# Patient Record
Sex: Female | Born: 1945 | Race: White | Hispanic: No
Health system: Southern US, Community
[De-identification: ages and names within clinical notes are randomized; demographics above are authoritative.]

## PROBLEM LIST (undated history)

## (undated) DIAGNOSIS — F1011 Alcohol abuse, in remission: Secondary | ICD-10-CM

## (undated) DIAGNOSIS — I10 Essential (primary) hypertension: Secondary | ICD-10-CM

## (undated) DIAGNOSIS — M8580 Other specified disorders of bone density and structure, unspecified site: Secondary | ICD-10-CM

## (undated) DIAGNOSIS — E7800 Pure hypercholesterolemia, unspecified: Secondary | ICD-10-CM

## (undated) DIAGNOSIS — F419 Anxiety disorder, unspecified: Secondary | ICD-10-CM

## (undated) DIAGNOSIS — M25519 Pain in unspecified shoulder: Secondary | ICD-10-CM

## (undated) DIAGNOSIS — E559 Vitamin D deficiency, unspecified: Secondary | ICD-10-CM

## (undated) DIAGNOSIS — G4700 Insomnia, unspecified: Secondary | ICD-10-CM

## (undated) DIAGNOSIS — Z8679 Personal history of other diseases of the circulatory system: Secondary | ICD-10-CM

## (undated) HISTORY — PX: TONSILLECTOMY: SUR1361

## (undated) HISTORY — DX: Pure hypercholesterolemia, unspecified: E78.00

## (undated) HISTORY — DX: Pain in unspecified shoulder: M25.519

## (undated) HISTORY — DX: Anxiety disorder, unspecified: F41.9

## (undated) HISTORY — DX: Alcohol abuse, in remission: F10.11

## (undated) HISTORY — PX: CHOLECYSTECTOMY: SHX55

## (undated) HISTORY — DX: Insomnia, unspecified: G47.00

## (undated) HISTORY — DX: Essential (primary) hypertension: I10

## (undated) HISTORY — DX: Personal history of other diseases of the circulatory system: Z86.79

## (undated) HISTORY — DX: Vitamin D deficiency, unspecified: E55.9

## (undated) HISTORY — DX: Other specified disorders of bone density and structure, unspecified site: M85.80

---

## 2012-01-24 DIAGNOSIS — J329 Chronic sinusitis, unspecified: Secondary | ICD-10-CM | POA: Diagnosis not present

## 2012-02-06 DIAGNOSIS — M25819 Other specified joint disorders, unspecified shoulder: Secondary | ICD-10-CM | POA: Diagnosis not present

## 2012-03-05 DIAGNOSIS — M25819 Other specified joint disorders, unspecified shoulder: Secondary | ICD-10-CM | POA: Diagnosis not present

## 2012-05-22 DIAGNOSIS — Z23 Encounter for immunization: Secondary | ICD-10-CM | POA: Diagnosis not present

## 2012-05-30 DIAGNOSIS — N898 Other specified noninflammatory disorders of vagina: Secondary | ICD-10-CM | POA: Diagnosis not present

## 2012-07-07 DIAGNOSIS — M25549 Pain in joints of unspecified hand: Secondary | ICD-10-CM | POA: Diagnosis not present

## 2012-07-07 DIAGNOSIS — M7520 Bicipital tendinitis, unspecified shoulder: Secondary | ICD-10-CM | POA: Diagnosis not present

## 2012-07-07 DIAGNOSIS — Z79899 Other long term (current) drug therapy: Secondary | ICD-10-CM | POA: Diagnosis not present

## 2012-10-24 DIAGNOSIS — Z23 Encounter for immunization: Secondary | ICD-10-CM | POA: Diagnosis not present

## 2013-01-27 DIAGNOSIS — Z23 Encounter for immunization: Secondary | ICD-10-CM | POA: Diagnosis not present

## 2013-01-27 DIAGNOSIS — T753XXA Motion sickness, initial encounter: Secondary | ICD-10-CM | POA: Diagnosis not present

## 2013-01-27 DIAGNOSIS — Z7189 Other specified counseling: Secondary | ICD-10-CM | POA: Diagnosis not present

## 2013-05-06 DIAGNOSIS — I10 Essential (primary) hypertension: Secondary | ICD-10-CM | POA: Diagnosis not present

## 2013-05-06 DIAGNOSIS — E7800 Pure hypercholesterolemia, unspecified: Secondary | ICD-10-CM | POA: Diagnosis not present

## 2013-05-06 DIAGNOSIS — E559 Vitamin D deficiency, unspecified: Secondary | ICD-10-CM | POA: Diagnosis not present

## 2013-05-06 DIAGNOSIS — Z79899 Other long term (current) drug therapy: Secondary | ICD-10-CM | POA: Diagnosis not present

## 2013-06-11 DIAGNOSIS — H524 Presbyopia: Secondary | ICD-10-CM | POA: Diagnosis not present

## 2013-06-11 DIAGNOSIS — H52229 Regular astigmatism, unspecified eye: Secondary | ICD-10-CM | POA: Diagnosis not present

## 2013-06-11 DIAGNOSIS — H5200 Hypermetropia, unspecified eye: Secondary | ICD-10-CM | POA: Diagnosis not present

## 2013-06-11 DIAGNOSIS — H2589 Other age-related cataract: Secondary | ICD-10-CM | POA: Diagnosis not present

## 2013-07-09 DIAGNOSIS — M25569 Pain in unspecified knee: Secondary | ICD-10-CM | POA: Diagnosis not present

## 2013-07-16 DIAGNOSIS — M25569 Pain in unspecified knee: Secondary | ICD-10-CM | POA: Diagnosis not present

## 2013-08-11 DIAGNOSIS — M25569 Pain in unspecified knee: Secondary | ICD-10-CM | POA: Diagnosis not present

## 2013-08-23 DIAGNOSIS — J209 Acute bronchitis, unspecified: Secondary | ICD-10-CM | POA: Diagnosis not present

## 2013-09-21 DIAGNOSIS — E559 Vitamin D deficiency, unspecified: Secondary | ICD-10-CM | POA: Diagnosis not present

## 2013-09-21 DIAGNOSIS — E7800 Pure hypercholesterolemia, unspecified: Secondary | ICD-10-CM | POA: Diagnosis not present

## 2013-12-09 DIAGNOSIS — H5200 Hypermetropia, unspecified eye: Secondary | ICD-10-CM | POA: Diagnosis not present

## 2013-12-09 DIAGNOSIS — H179 Unspecified corneal scar and opacity: Secondary | ICD-10-CM | POA: Diagnosis not present

## 2013-12-09 DIAGNOSIS — H52229 Regular astigmatism, unspecified eye: Secondary | ICD-10-CM | POA: Diagnosis not present

## 2013-12-09 DIAGNOSIS — H2510 Age-related nuclear cataract, unspecified eye: Secondary | ICD-10-CM | POA: Diagnosis not present

## 2014-04-21 DIAGNOSIS — Z1331 Encounter for screening for depression: Secondary | ICD-10-CM | POA: Diagnosis not present

## 2014-04-21 DIAGNOSIS — I10 Essential (primary) hypertension: Secondary | ICD-10-CM | POA: Diagnosis not present

## 2014-06-09 DIAGNOSIS — H2510 Age-related nuclear cataract, unspecified eye: Secondary | ICD-10-CM | POA: Diagnosis not present

## 2014-06-09 DIAGNOSIS — H35369 Drusen (degenerative) of macula, unspecified eye: Secondary | ICD-10-CM | POA: Diagnosis not present

## 2014-08-09 DIAGNOSIS — Z79899 Other long term (current) drug therapy: Secondary | ICD-10-CM | POA: Diagnosis not present

## 2014-08-09 DIAGNOSIS — E7800 Pure hypercholesterolemia, unspecified: Secondary | ICD-10-CM | POA: Diagnosis not present

## 2014-08-09 DIAGNOSIS — I10 Essential (primary) hypertension: Secondary | ICD-10-CM | POA: Diagnosis not present

## 2014-08-09 DIAGNOSIS — E559 Vitamin D deficiency, unspecified: Secondary | ICD-10-CM | POA: Diagnosis not present

## 2014-12-23 DIAGNOSIS — M23304 Other meniscus derangements, unspecified medial meniscus, left knee: Secondary | ICD-10-CM | POA: Diagnosis not present

## 2014-12-23 DIAGNOSIS — M25562 Pain in left knee: Secondary | ICD-10-CM | POA: Diagnosis not present

## 2015-04-25 DIAGNOSIS — M151 Heberden's nodes (with arthropathy): Secondary | ICD-10-CM | POA: Diagnosis not present

## 2015-04-25 DIAGNOSIS — M1812 Unilateral primary osteoarthritis of first carpometacarpal joint, left hand: Secondary | ICD-10-CM | POA: Diagnosis not present

## 2015-04-29 DIAGNOSIS — E780 Pure hypercholesterolemia: Secondary | ICD-10-CM | POA: Diagnosis not present

## 2015-04-29 DIAGNOSIS — Z79899 Other long term (current) drug therapy: Secondary | ICD-10-CM | POA: Diagnosis not present

## 2015-04-29 DIAGNOSIS — I10 Essential (primary) hypertension: Secondary | ICD-10-CM | POA: Diagnosis not present

## 2015-04-29 DIAGNOSIS — Z1389 Encounter for screening for other disorder: Secondary | ICD-10-CM | POA: Diagnosis not present

## 2015-04-29 DIAGNOSIS — F419 Anxiety disorder, unspecified: Secondary | ICD-10-CM | POA: Diagnosis not present

## 2015-04-29 DIAGNOSIS — Z23 Encounter for immunization: Secondary | ICD-10-CM | POA: Diagnosis not present

## 2015-04-29 DIAGNOSIS — E559 Vitamin D deficiency, unspecified: Secondary | ICD-10-CM | POA: Diagnosis not present

## 2015-06-13 DIAGNOSIS — M1812 Unilateral primary osteoarthritis of first carpometacarpal joint, left hand: Secondary | ICD-10-CM | POA: Diagnosis not present

## 2015-09-07 DIAGNOSIS — F419 Anxiety disorder, unspecified: Secondary | ICD-10-CM | POA: Diagnosis not present

## 2015-09-07 DIAGNOSIS — Z87898 Personal history of other specified conditions: Secondary | ICD-10-CM | POA: Diagnosis not present

## 2016-02-27 DIAGNOSIS — H25819 Combined forms of age-related cataract, unspecified eye: Secondary | ICD-10-CM | POA: Diagnosis not present

## 2016-02-27 DIAGNOSIS — H5203 Hypermetropia, bilateral: Secondary | ICD-10-CM | POA: Diagnosis not present

## 2016-02-27 DIAGNOSIS — H00022 Hordeolum internum right lower eyelid: Secondary | ICD-10-CM | POA: Diagnosis not present

## 2016-02-27 DIAGNOSIS — H52223 Regular astigmatism, bilateral: Secondary | ICD-10-CM | POA: Diagnosis not present

## 2016-05-04 DIAGNOSIS — E559 Vitamin D deficiency, unspecified: Secondary | ICD-10-CM | POA: Diagnosis not present

## 2016-05-04 DIAGNOSIS — R6889 Other general symptoms and signs: Secondary | ICD-10-CM | POA: Diagnosis not present

## 2016-05-04 DIAGNOSIS — E7800 Pure hypercholesterolemia, unspecified: Secondary | ICD-10-CM | POA: Diagnosis not present

## 2016-05-04 DIAGNOSIS — R202 Paresthesia of skin: Secondary | ICD-10-CM | POA: Diagnosis not present

## 2016-05-04 DIAGNOSIS — Z79899 Other long term (current) drug therapy: Secondary | ICD-10-CM | POA: Diagnosis not present

## 2016-05-22 DIAGNOSIS — E559 Vitamin D deficiency, unspecified: Secondary | ICD-10-CM | POA: Diagnosis not present

## 2016-06-25 ENCOUNTER — Encounter (HOSPITAL_COMMUNITY): Payer: Self-pay

## 2016-06-25 ENCOUNTER — Ambulatory Visit (HOSPITAL_COMMUNITY): Payer: Medicare Other | Admitting: Psychology

## 2016-06-25 ENCOUNTER — Other Ambulatory Visit (HOSPITAL_COMMUNITY): Payer: Medicare Other

## 2016-06-25 ENCOUNTER — Encounter (HOSPITAL_COMMUNITY): Payer: Self-pay | Admitting: Psychology

## 2016-06-25 DIAGNOSIS — F1020 Alcohol dependence, uncomplicated: Secondary | ICD-10-CM

## 2016-06-26 ENCOUNTER — Emergency Department (HOSPITAL_COMMUNITY): Payer: Medicare Other

## 2016-06-26 ENCOUNTER — Encounter (HOSPITAL_COMMUNITY): Payer: Self-pay

## 2016-06-26 ENCOUNTER — Emergency Department (HOSPITAL_COMMUNITY)
Admission: EM | Admit: 2016-06-26 | Discharge: 2016-06-26 | Disposition: A | Discharge status: Home / Self Care | Payer: Medicare Other | Attending: Emergency Medicine | Admitting: Emergency Medicine

## 2016-06-26 DIAGNOSIS — Y999 Unspecified external cause status: Secondary | ICD-10-CM | POA: Diagnosis not present

## 2016-06-26 DIAGNOSIS — Z79899 Other long term (current) drug therapy: Secondary | ICD-10-CM | POA: Insufficient documentation

## 2016-06-26 DIAGNOSIS — W01119A Fall on same level from slipping, tripping and stumbling with subsequent striking against unspecified sharp object, initial encounter: Secondary | ICD-10-CM | POA: Diagnosis not present

## 2016-06-26 DIAGNOSIS — Y929 Unspecified place or not applicable: Secondary | ICD-10-CM | POA: Insufficient documentation

## 2016-06-26 DIAGNOSIS — S0101XA Laceration without foreign body of scalp, initial encounter: Secondary | ICD-10-CM | POA: Insufficient documentation

## 2016-06-26 DIAGNOSIS — W19XXXA Unspecified fall, initial encounter: Secondary | ICD-10-CM

## 2016-06-26 DIAGNOSIS — S0990XA Unspecified injury of head, initial encounter: Secondary | ICD-10-CM | POA: Diagnosis not present

## 2016-06-26 DIAGNOSIS — Y939 Activity, unspecified: Secondary | ICD-10-CM | POA: Diagnosis not present

## 2016-06-26 DIAGNOSIS — S0191XA Laceration without foreign body of unspecified part of head, initial encounter: Secondary | ICD-10-CM

## 2016-06-26 DIAGNOSIS — S065X0A Traumatic subdural hemorrhage without loss of consciousness, initial encounter: Secondary | ICD-10-CM | POA: Diagnosis not present

## 2016-06-26 MED ORDER — ONDANSETRON HCL 4 MG PO TABS: 4.0000 mg | ORAL_TABLET | Freq: Four times a day (QID) | ORAL | Status: DC

## 2016-06-26 MED ORDER — ONDANSETRON 4 MG PO TBDP
4.0000 mg | ORAL_TABLET | Freq: Once | ORAL | Status: AC
  Administered 2016-06-26: 4 mg via ORAL
  Filled 2016-06-26: qty 1

## 2016-06-26 MED ORDER — LIDOCAINE HCL (PF) 1 % IJ SOLN
5.0000 mL | Freq: Once | INTRAMUSCULAR | Status: AC
  Administered 2016-06-26: 5 mL via INTRADERMAL
  Filled 2016-06-26: qty 30

## 2016-06-26 MED ORDER — ACETAMINOPHEN 325 MG PO TABS
650.0000 mg | ORAL_TABLET | Freq: Once | ORAL | Status: AC
  Administered 2016-06-26: 650 mg via ORAL
  Filled 2016-06-26: qty 2

## 2016-06-26 NOTE — ED Notes (Signed)
Pt fell last night. She was drinking and tripped.  Fell backwards striking head on pavement.  Laceration noted.  Bleeding is minimal at this time although is still actively bleeding when injury was approx 12 hours ago.  Pt denies LOC but is vague in response.  Unable to determine

## 2016-06-26 NOTE — ED Provider Notes (Signed)
CSN: [id number]     Arrival date & time 06/26/16  1327 History  By signing my name below, I, Freida Busman, attest that this documentation has been prepared under the direction and in the presence of non-physician practitioner, Cheri Fowler, PA-C. Electronically Signed: Freida Busman, Scribe. 06/26/2016. 2:02 PM.   Chief Complaint  Patient presents with  . Head Laceration    The history is provided by the patient. No language interpreter was used.    HPI Comments:  Kathleen Robles is a 70 y.o. female with a history of alcohol abuse, who presents to the Emergency Department complaining of a laceration to the back of her head s/p fall last night ~2300; bleeding controlled at this time. She reports associated mild HA. Pt rates her pain a 4/10. She has taken ibuprofen with mild relief. Pt states she was intoxicated when she tripped and fell backwards and struck the back of her head on concrete. She denies LOC, vomiting, numbness/weakenss in her extremities, vision changes, or neck pain. She also denies use of blood thinners. Pt reports nausea at this time and dizziness with leaning forward. Tetanus is UTD.   History reviewed. No pertinent past medical history. Past Surgical History  Procedure Laterality Date  . Tonsillectomy    . Cholecystectomy     History reviewed. No pertinent family history. Social History  Substance Use Topics  . Smoking status: Never Smoker   . Smokeless tobacco: None  . Alcohol Use: Yes   OB History    No data available     Review of Systems  Eyes: Negative for visual disturbance.  Gastrointestinal: Positive for nausea. Negative for vomiting.  Skin: Positive for wound.  Neurological: Positive for headaches. Negative for syncope.  All other systems reviewed and are negative.   Allergies  Codeine  Home Medications   Prior to Admission medications   Medication Sig Start Date End Date Taking? Authorizing Provider  sertraline (ZOLOFT) 25 MG tablet 1 TABLET ONCE A  DAY ORALLY 06/18/16  Yes Historical Provider, MD  trandolapril (MAVIK) 2 MG tablet 1 TABLET ONCE A DAY BY MOUTH 04/26/16  Yes Historical Provider, MD  traZODone (DESYREL) 100 MG tablet Take 100 mg by mouth at bedtime as needed for sleep.  06/22/16  Yes Historical Provider, MD  hydrOXYzine (ATARAX/VISTARIL) 10 MG tablet TAKE ONE TABLET BY MOUTH DAILY AT BEDTIME FOR ANXIETY 05/14/16   Historical Provider, MD  Vitamin D, Ergocalciferol, (DRISDOL) 50000 units CAPS capsule TAKE 1 CAPSULE ONCE A WEEK ORALLY 05/08/16   Historical Provider, MD   BP 161/88 mmHg  Pulse 91  Temp(Src) 98.6 F (37 C) (Oral)  Resp 18  SpO2 100% Physical Exam  Constitutional: She is oriented to person, place, and time. She appears well-developed and well-nourished.  Non-toxic appearance. She does not have a sickly appearance. She does not appear ill.  HENT:  Head: Normocephalic. Head is with laceration.  Mouth/Throat: Oropharynx is clear and moist.  3 cm laceration to the superior posterior parietal scalp.   Eyes: Conjunctivae are normal. Pupils are equal, round, and reactive to light.  Neck: Normal range of motion. Neck supple.  No cervical midline tenderness.   Cardiovascular: Normal rate and regular rhythm.   Pulmonary/Chest: Effort normal and breath sounds normal. No accessory muscle usage or stridor. No respiratory distress. She has no wheezes. She has no rhonchi. She has no rales.  Abdominal: Soft. Bowel sounds are normal. She exhibits no distension. There is no tenderness.  Musculoskeletal: Normal range of motion.  Lymphadenopathy:    She has no cervical adenopathy.  Neurological: She is alert and oriented to person, place, and time.  Mental Status:   AOx3.  Speech clear without dysarthria. Cranial Nerves:  I-not tested  II-PERRLA  III, IV, VI-EOMs intact  V-temporal and masseter strength intact  VII-symmetrical facial movements intact, no facial droop  VIII-hearing grossly intact bilaterally  IX, X-gag  intact  XI-strength of sternomastoid and trapezius muscles 5/5  XII-tongue midline Motor:   Good muscle bulk and tone  Strength 5/5 bilaterally in upper and lower extremities   Cerebellar--intact RAMs, finger to nose intact bilaterally.  Gait normal.  No pronator drift Sensory:  Intact in upper and lower extremities   Skin: Skin is warm and dry.  Psychiatric: She has a normal mood and affect. Her behavior is normal.  Nursing note and vitals reviewed.   ED Course  Procedures  LACERATION REPAIR Performed by: Cheri FowlerKayla Reiana Poteet Consent: Verbal consent obtained. Risks and benefits: risks, benefits and alternatives were discussed Patient identity confirmed: provided demographic data Time out performed prior to procedure Prepped and Draped in normal sterile fashion Wound explored Laceration Location: superior posterior parietal scalp Laceration Length: 3 cm No Foreign Bodies seen or palpated Anesthesia: local infiltration Local anesthetic: lidocaine 1% without epinephrine Anesthetic total: 8 ml Irrigation method: syringe Amount of cleaning: standard Skin closure: staple Number of sutures or staples: 7 Technique: staples Patient tolerance: Patient tolerated the procedure well with no immediate complications.  DIAGNOSTIC STUDIES:  Oxygen Saturation is 100% on RA, normal by my interpretation.    COORDINATION OF CARE:  1:57 PM Will order CT head and repair laceration. Discussed treatment plan with pt at bedside and pt agreed to plan.  Labs Review Labs Reviewed - No data to display  Imaging Review Ct Head Wo Contrast  06/26/2016  CLINICAL DATA:  Pain following fall 1 day prior EXAM: CT HEAD WITHOUT CONTRAST TECHNIQUE: Contiguous axial images were obtained from the base of the skull through the vertex without intravenous contrast. COMPARISON:  None. FINDINGS: Brain: The ventricles are normal in size and configuration. There is a small left-sided tentorial subdural hematoma measuring  1.4 x 1.0 cm which is not causing appreciable mass effect. There are no other extra-axial fluid collections. There is no intra-axial hemorrhage. There is no mass or midline shift. Gray-white compartments appear normal. No acute infarct evident. Vascular: There is minimal calcification in the cavernous carotid artery regions bilaterally. Skull: Bony calvarium appears intact. Sinuses/Orbits: Visualized orbits appear symmetric bilaterally. Visualized paranasal sinuses are clear. Other: Visualized mastoid air cells are clear. There is soft tissue injury in the left occipital region with air in the soft tissues. No fracture is evident in this region. IMPRESSION: Small left-sided tentorial subdural hematoma without appreciable mass effect. No focal gray-white compartment lesions. No acute infarct. No midline shift. Soft tissue injury left occipital scalp.  No fracture. Critical Value/emergent results were called by telephone at the time of interpretation on 06/26/2016 at 2:29 pm to [facility] Cori Henningsen, PA , who verbally acknowledged these results. Electronically Signed   By: Bretta BangWilliam  Woodruff III M.D.   On: 06/26/2016 14:30   I have personally reviewed and evaluated these images and lab results as part of my medical decision-making.   EKG Interpretation None         MDM   Final diagnoses:  Fall, initial encounter  Laceration of head, initial encounter  Head injury, initial encounter   Patient presents with head injury s/p fall while intoxicated  approximately 11 PM yesterday.  No LOC.  VSS, NAD.  3 cm laceration to parietal scalp.  Normal neurological exam, no focal deficits.  [facility], will obtain CT head.  CT head shows small left-sided tentorial subdural hematoma without appreciable mass effect.  Spoke with Dr. Franky Machoabbell, neurosurgery, no indication for further work up given normal neurological exam and CT findings.  Tetanus up to date. Lac repaired with 7 staples.  Able to ambulate in ED  without difficulty.  Recommend Tylenol or Ibuprofen for pain.  Return in 7 days for staple removal.  Dicussed return precautions.  Patient agrees and acknowledges the above plan for discharge.   Case has been discussed with and seen by Dr. Jodi MourningZavitz who agrees with the above plan for discharge.  I personally performed the services described in this documentation, which was scribed in my presence. The recorded information has been reviewed and is accurate.    Cheri FowlerKayla Dorothee Napierkowski, PA-C 06/26/16 1551  Blane OharaJoshua Zavitz, MD 06/26/16 [phone]

## 2016-06-26 NOTE — Discharge Instructions (Signed)
Head Injury, Adult You have a head injury. Headaches and throwing up (vomiting) are common after a head injury. It should be easy to wake up from sleeping. Sometimes you must stay in the hospital. Most problems happen within the first 24 hours. Side effects may occur up to 7-10 days after the injury.  WHAT ARE THE TYPES OF HEAD INJURIES? Head injuries can be as minor as a bump. Some head injuries can be more severe. More severe head injuries include:  A jarring injury to the brain (concussion).  A bruise of the brain (contusion). This mean there is bleeding in the brain that can cause swelling.  A cracked skull (skull fracture).  Bleeding in the brain that collects, clots, and forms a bump (hematoma). WHEN SHOULD I GET HELP RIGHT AWAY?   You are confused or sleepy.  You cannot be woken up.  You feel sick to your stomach (nauseous) or keep throwing up (vomiting).  Your dizziness or unsteadiness is getting worse.  You have very bad, lasting headaches that are not helped by medicine. Take medicines only as told by your doctor.  You cannot use your arms or legs like normal.  You cannot walk.  You notice changes in the black spots in the center of the colored part of your eye (pupil).  You have clear or bloody fluid coming from your nose or ears.  You have trouble seeing. During the next 24 hours after the injury, you must stay with someone who can watch you. This person should get help right away (call 911 in the [place] if you start to shake and are not able to control it (have seizures), you pass out, or you are unable to wake up. HOW CAN I PREVENT A HEAD INJURY IN THE FUTURE?  Wear seat belts.  Wear a helmet while bike riding and playing sports like football.  Stay away from dangerous activities around the house. WHEN CAN I RETURN TO NORMAL ACTIVITIES AND ATHLETICS? See your doctor before doing these activities. You should not do normal activities or play contact sports until 1  week after the following symptoms have stopped:  Headache that does not go away.  Dizziness.  Poor attention.  Confusion.  Memory problems.  Sickness to your stomach or throwing up.  Tiredness.  Fussiness.  Bothered by bright lights or loud noises.  Anxiousness or depression.  Restless sleep. MAKE SURE YOU:   Understand these instructions.  Will watch your condition.  Will get help right away if you are not doing well or get worse.   This information is not intended to replace advice given to you by your health care provider. Make sure you discuss any questions you have with your health care provider.   Document Released: 11/22/2008 Document Revised: 12/31/2014 Document Reviewed: 08/17/2013 Elsevier Interactive Patient Education 2016 [employer].   Stitches, Singac, or Adhesive Wound Closure Health care providers use stitches (sutures), staples, and certain glue (skin adhesives) to hold skin together while it heals (wound closure). You may need this treatment after you have surgery or if you cut your skin accidentally. These methods help your skin to heal more quickly and make it less likely that you will have a scar. A wound may take several months to heal completely. The type of wound you have determines when your wound gets closed. In most cases, the wound is closed as soon as possible (primary skin closure). Sometimes, closure is delayed so the wound can be cleaned and allowed to  heal naturally. This reduces the chance of infection. Delayed closure may be needed if your wound:  Is caused by a bite.  Happened more than 6 hours ago.  Involves loss of skin or the tissues under the skin.  Has dirt or debris in it that cannot be removed.  Is infected. WHAT ARE THE DIFFERENT KINDS OF WOUND CLOSURES? There are many options for wound closure. The one that your health care provider uses depends on how deep and how large your wound is. Adhesive Glue To use this type  of glue to close a wound, your health care provider holds the edges of the wound together and paints the glue on the surface of your skin. You may need more than one layer of glue. Then the wound may be covered with a light bandage (dressing). This type of skin closure may be used for small wounds that are not deep (superficial). Using glue for wound closure is less painful than other methods. It does not require a medicine that numbs the area (local anesthetic). This method also leaves nothing to be removed. Adhesive glue is often used for children and on facial wounds. Adhesive glue cannot be used for wounds that are deep, uneven, or bleeding. It is not used inside of a wound.  Adhesive Strips These strips are made of sticky (adhesive), porous paper. They are applied across your skin edges like a regular adhesive bandage. You leave them on until they fall off. Adhesive strips may be used to close very superficial wounds. They may also be used along with sutures to improve the closure of your skin edges.  Sutures Sutures are the oldest method of wound closure. Sutures can be made from natural substances, such as silk, or from synthetic materials, such as nylon and steel. They can be made from a material that your body can break down as your wound heals (absorbable), or they can be made from a material that needs to be removed from your skin (nonabsorbable). They come in many different strengths and sizes. Your health care provider attaches the sutures to a steel needle on one end. Sutures can be passed through your skin, or through the tissues beneath your skin. Then they are tied and cut. Your skin edges may be closed in one continuous stitch or in separate stitches. Sutures are strong and can be used for all kinds of wounds. Absorbable sutures may be used to close tissues under the skin. The disadvantage of sutures is that they may cause skin reactions that lead to infection. Nonabsorbable sutures need  to be removed. Staples When surgical staples are used to close a wound, the edges of your skin on both sides of the wound are brought close together. A staple is placed across the wound, and an instrument secures the edges together. Staples are often used to close surgical cuts (incisions). Staples are faster to use than sutures, and they cause less skin reaction. Staples need to be removed using a tool that bends the staples away from your skin. HOW DO I CARE FOR MY WOUND CLOSURE?  Take medicines only as directed by your health care provider.  If you were prescribed an antibiotic medicine for your wound, finish it all even if you start to feel better.  Use ointments or creams only as directed by your health care provider.  Wash your hands with soap and water before and after touching your wound.  Do not soak your wound in water. Do not take baths, swim,  or use a hot tub until your health care provider approves.  Ask your health care provider when you can start showering. Cover your wound if directed by your health care provider.  Do not take out your own sutures or staples.  Do not pick at your wound. Picking can cause an infection.  Keep all follow-up visits as directed by your health care provider. This is important. HOW LONG WILL I HAVE MY WOUND CLOSURE?  Leave adhesive glue on your skin until the glue peels away.  Leave adhesive strips on your skin until the strips fall off.  Absorbable sutures will dissolve within several days.  Nonabsorbable sutures and staples must be removed. The location of the wound will determine how long they stay in. This can range from several days to a couple of weeks. WHEN SHOULD I SEEK HELP FOR MY WOUND CLOSURE? Contact your health care provider if:  You have a fever.  You have chills.  You have drainage, redness, swelling, or pain at your wound.  There is a bad smell coming from your wound.  The skin edges of your wound start to separate  after your sutures have been removed.  Your wound becomes thick, raised, and darker in color after your sutures come out (scarring).   This information is not intended to replace advice given to you by your health care provider. Make sure you discuss any questions you have with your health care provider.   Document Released: 09/04/2001 Document Revised: 12/31/2014 Document Reviewed: 05/19/2014 Elsevier Interactive Patient Education 2016 Elsevier Inc.Community Resource Guide Outpatient Counseling/Substance Abuse Adult The [employer] Ways 211 is a great source of information about community services available.  Access by dialing 2-1-1 from anywhere in [place] , or by website -  PooledIncome.plwww.nc211.org.   Other Local Resources (Updated 12/2015)  Crisis Hotlines   Services     Area Served  [facility]  Crisis Hotline, available 24 hours a day, 7 days a week: 954-651-6691760 465 2523 [facility], [place]   [facility]  Crisis Hotline, available 24 hours a day, 7 days a week: [phone] [facility], [place]  [facility]  Suicide Prevention Hotline, available 24 hours a day, 7 days a week: 236-829-9310 [facility] [place], [place]  [facility]   Crisis Hotline, available 24 hours a day, 7 days a week: [phone] [facility], [place]   [facility] Access to [employer]  Crisis Hotline, available 24 hours a day, 7 days a week: [phone] All   [facility]  Crisis Hotline, available 24 hours a day, 7 days a week: 5012386828([phone]) [phone] All   Other Local Resources (Updated 12/2015)  Outpatient Counseling/ Substance Abuse Programs  Services     Address and Phone Number  ADS ([facility] Drug [facility])   Options include Individual counseling, group counseling, intensive outpatient program (several hours a day, several days a week)  Offers depression assessments  Provides methadone maintenance program [phone] [address], [address] [place], [place] [address]   [facility]   Offers partial hospitalization/day treatment and DUI/DWI programs  [employer] Medicare, private insurance [phone] [address], [address] [place], [place] [id number]  [facility]    Services include intensive outpatient program (several hours a day, several days a week), outpatient treatment, DUI/DWI services, family education  Also has some services specifically for [employer]  Offers transitional housing  [phone] [address] [place], [place] [id number]     [facility]  [employer] Medicare, private pay, and private insurance [phone]  [address], [address] [place], [place] [address]  [employer] [facility]  Services include individual counseling, substance abuse intensive outpatient program (several hours a day, several days a week), day treatment  Delene Loll, Medicaid, private insurance [phone] [address], [address] [place], [place] [address]  [facility]   Offers substance abuse intensive outpatient program (several hours a day, several days a week), partial hospitalization program [phone] [address] [place], [place] [address]  [phone] [address] [place], [place] [address]  [phone] [address] [place] [place], [place] [address]  [phone], [address] [place], [place] [address]  [facility]  Individual counseling only  Accepts private insurance only [phone] [address] [place], [place] [address]  [facility]  Methadone maintenance program [phone] [address] [place], [place] [address]  [facility]  Walk-In Clinic providing substance abuse and mental health counseling  Accepts Medicaid, Medicare, private insurance  Offers sliding scale for uninsured [phone] [address] [place], [place]   [facility] [place],  [employer].  Offers individual counseling, and intensive in-home services [phone] [address], [address] [place], [place] [address]  [facility] individual counseling, family counseling, group therapy, domestic violence counseling, consumer credit counseling  Accepts Medicare, Medicaid, private insurance  Offers sliding scale for uninsured [phone] [address] [place], [place] [address]  [phone] [facility], [address] [place], [place] [address]  [facility]  Offers individual, family and group counseling  3 locations - [place], [place], and [place]  [phone]  [address] [place], [place] [address]  [address] [place], [place] [address]  [address] [place], [place] [address]  [name]    Offers psychiatric assessment, 8-week Intensive Outpatient Program (several hours a day, several times a week, daytime or evenings), early recovery group, family Program, medication management  Private pay or private insurance only [phone], or  [phone] [address] [place], [place] [address]  [facility] [employer] individual, couples and family counseling  Accepts Medicaid, private insurance, and sliding scale for uninsured [phone] [address] [place], [place] [address]  Len Blalock, MD  Individual counseling  Private insurance [phone] [address] [place], [place] [address]  [facility]   Offers assessment, substance abuse treatment, and behavioral health treatment [phone] [address] [place], [place] [address]  [facility]  Individual counseling  Accepts private insurance [phone] [address] [place], [place] [address]  [facility]  Individual counseling  Accepts Medicare, private insurance [phone] [address] [place], [place] [address]  [facility]     Offers intensive outpatient program (several hours a day, several times a week)  Private pay, private insurance [phone] [facility] [place], [place]  [facility]  Individual counseling  Medicare, private insurance [phone] [address] [place], [place] [address]  [facility]    Offers intensive outpatient program (several hours a day, several times a week) and partial hospitalization program [phone] [address] [place], [place] [address]  Emerson Monte, MD  Individual counseling [phone] [address] [place], [place] [address]  [facility]  Offers Christian counseling to individuals, couples, and families  Accepts Medicare and private insurance; offers sliding scale for uninsured [phone] [address] [place], [place] [address]  [facility]  Christian counseling [phone] [address] [place], [place] [address]  [facility] crisis counseling, individual counseling, group therapy, in-home therapy, domestic violence services, day treatment, DWI services, Administrator, arts (CST), Doctor, hospital (ACTT), substance abuse Intensive Outpatient Program (several hours a day, several times a week)  2 locations - [place] and [place] [phone] [address] [place], [place] [address]  [phone] [address] [place], [place] [address]  [facility]     Individual counseling and group therapy  [employer], [place], [place] [phone] [address] [place], [place]  [facility]  Offers individual and family counseling  Offers LGBTQ services  Accepts private insurance and private pay [phone] [address] [place], [place] [address]  [facility]    Offers individual counseling, group therapy, and  outpatient detox  Accepts private insurance [phone] [address] [place], [place]  [facility]  Individual counseling  Accepts Medicare, private insurance [phone] [address] [place], [place] [address]  [employer]  Individual counseling  Accepts Medicare, private insurance [phone] [address] [place], [place] [address]  [facility]   Offers substance abuse Intensive Outpatient Program (several hours a day, several times a week) [phone], or [phone] [place], [place]

## 2016-06-27 ENCOUNTER — Other Ambulatory Visit (HOSPITAL_COMMUNITY): Payer: Medicare Other

## 2016-06-27 ENCOUNTER — Telehealth (HOSPITAL_COMMUNITY): Payer: Self-pay | Admitting: Psychology

## 2016-06-27 ENCOUNTER — Emergency Department (HOSPITAL_COMMUNITY)
Admission: EM | Admit: 2016-06-27 | Discharge: 2016-06-27 | Disposition: A | Discharge status: Home / Self Care | Payer: Medicare Other | Attending: Emergency Medicine | Admitting: Emergency Medicine

## 2016-06-27 ENCOUNTER — Emergency Department (HOSPITAL_COMMUNITY): Payer: Medicare Other

## 2016-06-27 ENCOUNTER — Encounter (HOSPITAL_COMMUNITY): Payer: Self-pay | Admitting: Psychology

## 2016-06-27 ENCOUNTER — Encounter (HOSPITAL_COMMUNITY): Payer: Self-pay | Admitting: Emergency Medicine

## 2016-06-27 DIAGNOSIS — G44319 Acute post-traumatic headache, not intractable: Secondary | ICD-10-CM | POA: Insufficient documentation

## 2016-06-27 DIAGNOSIS — S065X0A Traumatic subdural hemorrhage without loss of consciousness, initial encounter: Secondary | ICD-10-CM | POA: Diagnosis not present

## 2016-06-27 DIAGNOSIS — R51 Headache: Secondary | ICD-10-CM | POA: Diagnosis present

## 2016-06-27 MED ORDER — HYDROCODONE-ACETAMINOPHEN 5-325 MG PO TABS: 2.0000 | ORAL_TABLET | ORAL | Status: DC | PRN

## 2016-06-27 MED ORDER — PROMETHAZINE HCL 25 MG PO TABS
12.5000 mg | ORAL_TABLET | Freq: Once | ORAL | Status: AC
  Administered 2016-06-27: 12.5 mg via ORAL
  Filled 2016-06-27: qty 1

## 2016-06-27 MED ORDER — HYDROMORPHONE HCL 1 MG/ML IJ SOLN: 1.0000 mg | Freq: Once | INTRAMUSCULAR | Status: DC

## 2016-06-27 MED ORDER — ONDANSETRON HCL 4 MG/2ML IJ SOLN
4.0000 mg | Freq: Once | INTRAMUSCULAR | Status: AC
  Administered 2016-06-27: 4 mg via INTRAMUSCULAR
  Filled 2016-06-27: qty 2

## 2016-06-27 MED ORDER — ONDANSETRON HCL 4 MG/2ML IJ SOLN: 4.0000 mg | Freq: Once | INTRAMUSCULAR | Status: DC

## 2016-06-27 MED ORDER — HYDROCODONE-ACETAMINOPHEN 5-325 MG PO TABS
2.0000 | ORAL_TABLET | Freq: Once | ORAL | Status: AC
  Administered 2016-06-27: 2 via ORAL
  Filled 2016-06-27: qty 2

## 2016-06-27 MED ORDER — FENTANYL CITRATE (PF) 100 MCG/2ML IJ SOLN
50.0000 ug | Freq: Once | INTRAMUSCULAR | Status: AC
  Administered 2016-06-27: 50 ug via INTRAMUSCULAR
  Filled 2016-06-27: qty 2

## 2016-06-27 MED ORDER — PROMETHAZINE HCL 25 MG PO TABS: 25.0000 mg | ORAL_TABLET | Freq: Four times a day (QID) | ORAL | Status: DC | PRN

## 2016-06-27 NOTE — Discharge Instructions (Signed)
Post-Concussion Syndrome Post-concussion syndrome describes the symptoms that can occur after a head injury. These symptoms can last from weeks to months. CAUSES  It is not clear why some head injuries cause post-concussion syndrome. It can occur whether your head injury was mild or severe and whether you were wearing head protection or not.  SIGNS AND SYMPTOMS  Memory difficulties.  Dizziness.  Headaches.  Double vision or blurry vision.  Sensitivity to light.  Hearing difficulties.  Depression.  Tiredness.  Weakness.  Difficulty with concentration.  Difficulty sleeping or staying asleep.  Vomiting.  Poor balance or instability on your feet.  Slow reaction time.  Difficulty learning and remembering things you have heard. DIAGNOSIS  There is no test to determine whether you have post-concussion syndrome. Your health care provider may order an imaging scan of your brain, such as a CT scan, to check for other problems that may be causing your symptoms (such as a severe injury inside your skull). TREATMENT  Usually, these problems disappear over time without medical care. Your health care provider may prescribe medicine to help ease your symptoms. It is important to follow up with a neurologist to evaluate your recovery and address any lingering symptoms or issues. HOME CARE INSTRUCTIONS   Take medicines only as directed by your health care provider. Do not take aspirin. Aspirin can slow blood clotting.  Sleep with your head slightly elevated to help with headaches.  Avoid any situation where there is potential for another head injury. This includes football, hockey, soccer, basketball, martial arts, downhill snow sports, and horseback riding. Your condition will get worse every time you experience a concussion. You should avoid these activities until you are evaluated by the appropriate follow-up health care providers.  Keep all follow-up visits as directed by your health  care provider. This is important. SEEK MEDICAL CARE IF:  You have increased problems paying attention or concentrating.  You have increased difficulty remembering or learning new information.  You need more time to complete tasks or assignments than before.  You have increased irritability or decreased ability to cope with stress.  You have more symptoms than before. Seek medical care if you have any of the following symptoms for more than two weeks after your injury:  Lasting (chronic) headaches.  Dizziness or balance problems.  Nausea.  Vision problems.  Increased sensitivity to noise or light.  Depression or mood swings.  Anxiety or irritability.  Memory problems.  Difficulty concentrating or paying attention.  Sleep problems.  Feeling tired all the time. SEEK IMMEDIATE MEDICAL CARE IF:  You have confusion or unusual drowsiness.  Others find it difficult to wake you up.  You have nausea or persistent, forceful vomiting.  You feel like you are moving when you are not (vertigo). Your eyes may move rapidly back and forth.  You have convulsions or faint.  You have severe, persistent headaches that are not relieved by medicine.  You cannot use your arms or legs normally.  One of your pupils is larger than the other.  You have clear or bloody discharge from your nose or ears.  Your problems are getting worse, not better. MAKE SURE YOU:  Understand these instructions.  Will watch your condition.  Will get help right away if you are not doing well or get worse.   This information is not intended to replace advice given to you by your health care provider. Make sure you discuss any questions you have with your health care provider.  Document Released: 06/01/2002 Document Revised: 12/31/2014 Document Reviewed: 03/17/2014 Elsevier Interactive Patient Education 2016 Elsevier Inc. Subdural Hematoma A subdural hematoma is a collection of blood between the  brain and its tough outermost membrane covering (the dura). Blood clots that form in this area push down on the brain and cause irritation. A subdural hematoma may cause parts of the brain to stop working and eventually cause death.  CAUSES A subdural hematoma is caused by bleeding from a ruptured blood vessel (hemorrhage). The bleeding results from trauma to the head, such as from a fall or motor vehicle accident. There are two types of subdural hemorrhages:  Acute. This type develops shortly after a serious blow to the head and causes blood to collect very quickly. If not diagnosed and treated promptly, severe brain injury or death can occur.  Chronic. This is when bleeding develops more slowly, over weeks or months. RISK FACTORS People at risk for subdural hematoma include older persons, infants, and alcoholics. SYMPTOMS An acute subdural hemorrhage develops over minutes to hours. Symptoms can include:  Temporary loss of consciousness.  Weakness of arms or legs on one side of the body.  Changes in vision or speech.  A severe headache.  Seizures.  Nausea and vomiting.  Increased sleepiness. A chronic subdural hemorrhage develops over weeks to months. Symptoms may develop slowly and produce less noticeable problems or changes. Symptoms include:  A mild headache.  A change in personality.  Loss of balance or difficulty walking.  Weakness, numbness, or tingling in the arms or legs.  Nausea or vomiting.  Memory loss.  Double vision.  Increased sleepiness. DIAGNOSIS Your health care provider will perform a thorough physical and neurological exam. A CT scan or MRI may also be done. If there is blood on the scan, its color will help your health care provider determine how long the hemorrhage has been there. TREATMENT If the cause is an acute subdural hemorrhage, immediate treatment is needed. In many cases an emergency surgery is performed to drain accumulated blood or to  remove the blood clot. Sometimes steroid or diuretic medicines or controlled breathing through a ventilator is needed to decrease pressure in the brain. This is especially true if there is any swelling of the brain. If the cause is a chronic subdural hemorrhage, treatment depends on a variety of factors. Sometimes no treatment is needed. If the subdural hematoma is small and causes minimal or no symptoms, you may be treated with bed rest, medicines, and observation. If the hemorrhage is large or if you have neurological symptoms, an emergency surgery is usually needed to remove the blood clot. People who develop a subdural hemorrhage are at risk of developing seizures, even after the subdural hematoma has been treated. You may be prescribed an anti-seizure (anticonvulsant) medicine for a year or longer. HOME CARE INSTRUCTIONS  Only take medicines as directed by your health care provider.  Rest if directed by your health care provider.  Keep all follow-up appointments with your health care provider.  If you play a contact sport such as football, hockey or soccer and you experienced a significant head injury, allow enough time for healing (up to 15 days) before you start playing again. A repeated injury that occurs during this fragile repair period is likely to result in hemorrhage. This is called the second impact syndrome. SEEK IMMEDIATE MEDICAL CARE IF:  You fall or experience minor trauma to your head and you are taking blood thinners. If you are on any blood  thinners even a very small injury can cause a subdural hematoma. You should not hesitate to seek medical attention regardless of how minor you think your symptoms are.  You experience a head injury and have:  Drowsiness or a decrease in alertness.  Confusion or forgetfulness.  Slurred speech.  Irrational or aggressive behavior.  Numbness or paralysis in any part of the body.  A feeling of being sick to your stomach (nauseous) or  you throw up (vomit).  Difficulty walking or poor coordination.  Double vision.  Seizures.  A bleeding disorder.  A history of heavy alcohol use.  Clear fluid draining from your nose or ears.  Personality changes.  Difficulty thinking.  Worsening symptoms. MAKE SURE YOU:  Understand these instructions.  Will watch your condition.  Will get help right away if you are not doing well or get worse. FOR MORE INFORMATION National Institute of Neurological Disorders and Stroke: [place].co.ukwww.ninds.nih.gov American Association of Neurological Surgeons: www.neurosurgerytoday.org American Academy of Neurology (AAN): ComparePet.czwww.aan.com Brain Injury Association of America: www.biausa.org   This information is not intended to replace advice given to you by your health care provider. Make sure you discuss any questions you have with your health care provider.   Document Released: 10/27/2004 Document Revised: 09/30/2013 Document Reviewed: 06/12/2013 Elsevier Interactive Patient Education [employer]2016 Elsevier Inc.

## 2016-06-27 NOTE — Progress Notes (Addendum)
Pt confirms pcp as Kathleen Robles Pt had questions about pain and nausea medications but the ED PA/NP came in room as ED Cm was visiting with her and offered answers

## 2016-06-27 NOTE — ED Notes (Signed)
Patient presents for dizziness, headache and nausea starting earlier today. Seen yesterday for head injury. Staples intact. Bleeding controlled. Rates pain 5/10.

## 2016-06-27 NOTE — ED Provider Notes (Signed)
CSN: [id number]     Arrival date & time 06/27/16  1127 History   First MD Initiated Contact with Patient 06/27/16 1212     Chief Complaint  Patient presents with  . Dizziness  . Headache  . Nausea     (Consider location/radiation/quality/duration/timing/severity/associated sxs/prior Treatment) Patient is a 70 y.o. female presenting with dizziness and headaches. The history is provided by the patient. No language interpreter was used.  Dizziness Quality:  Lightheadedness Severity:  Moderate Onset quality:  Gradual Duration:  2 days Timing:  Constant Progression:  Worsening Chronicity:  New Context: head movement   Relieved by:  Nothing Worsened by:  Nothing Ineffective treatments:  None tried Associated symptoms: headaches   Risk factors: no anemia   Headache Associated symptoms: dizziness   Pt complains of a headache today.  Pt reports she is nauseated.  Pt reports no relief with tylenol and zofran  History reviewed. No pertinent past medical history. Past Surgical History  Procedure Laterality Date  . Tonsillectomy    . Cholecystectomy     No family history on file. Social History  Substance Use Topics  . Smoking status: Never Smoker   . Smokeless tobacco: None  . Alcohol Use: Yes   OB History    No data available     Review of Systems  Neurological: Positive for dizziness and headaches.  All other systems reviewed and are negative.     Allergies  Codeine  Home Medications   Prior to Admission medications   Medication Sig Start Date End Date Taking? Authorizing Provider  hydrOXYzine (ATARAX/VISTARIL) 10 MG tablet TAKE ONE TABLET BY MOUTH DAILY AT BEDTIME FOR ANXIETY 05/14/16  Yes Historical Provider, MD  ondansetron (ZOFRAN) 4 MG tablet Take 1 tablet (4 mg total) by mouth every 6 (six) hours. Patient taking differently: Take 4 mg by mouth every 6 (six) hours as needed for nausea or vomiting.  06/26/16  Yes Kayla Rose, PA-C  sertraline (ZOLOFT) 25 MG  tablet 1 TABLET ONCE A DAY ORALLY 06/18/16  Yes Historical Provider, MD  trandolapril (MAVIK) 2 MG tablet 1 TABLET ONCE A DAY BY MOUTH daily 04/26/16  Yes Historical Provider, MD  traZODone (DESYREL) 100 MG tablet Take 100 mg by mouth at bedtime as needed for sleep.  06/22/16  Yes Historical Provider, MD  Vitamin D, Ergocalciferol, (DRISDOL) 50000 units CAPS capsule TAKE 1 CAPSULE by mouth on wednesdays 05/08/16  Yes Historical Provider, MD   BP 164/95 mmHg  Pulse 67  Temp(Src) 98.4 F (36.9 C) (Oral)  Resp 18  SpO2 99% Physical Exam  Constitutional: She is oriented to person, place, and time. She appears well-developed and well-nourished.  HENT:  Head: Normocephalic and atraumatic.  Right Ear: External ear normal.  Left Ear: External ear normal.  Nose: Nose normal.  Mouth/Throat: Oropharynx is clear and moist.  Eyes: EOM are normal. Pupils are equal, round, and reactive to light.  Neck: Normal range of motion. Neck supple.  Pulmonary/Chest: Effort normal.  Abdominal: She exhibits no distension.  Musculoskeletal: Normal range of motion.  Neurological: She is alert and oriented to person, place, and time.  Skin: Skin is warm.  Psychiatric: She has a normal mood and affect.  Nursing note and vitals reviewed.   ED Course  Procedures (including critical care time) Labs Review Labs Reviewed - No data to display  Imaging Review Ct Head Wo Contrast  06/27/2016  CLINICAL DATA:  Dizziness with headache and nausea starting earlier today after a fall yesterday.  Subsequent. EXAM: CT HEAD WITHOUT CONTRAST TECHNIQUE: Contiguous axial images were obtained from the base of the skull through the vertex without intravenous contrast. COMPARISON:  06/26/2016. FINDINGS: Previously described small left-sided subdural hematoma on the tentorium is stable is slightly decreased in the interval. There is no evidence for hydrocephalus, mass lesion, or abnormal extra-axial fluid collection. No definite CT evidence  for acute infarction. The visualized paranasal sinuses and mastoid air cells are clear. No evidence for skull fracture. Skin staples are seen in the left parietal scalp. IMPRESSION: Stable small left tentorial subdural hematoma. No new or progressive findings. Electronically Signed   By: Kennith CenterEric  Mansell M.D.   On: 06/27/2016 14:20   Ct Head Wo Contrast  06/26/2016  CLINICAL DATA:  Pain following fall 1 day prior EXAM: CT HEAD WITHOUT CONTRAST TECHNIQUE: Contiguous axial images were obtained from the base of the skull through the vertex without intravenous contrast. COMPARISON:  None. FINDINGS: Brain: The ventricles are normal in size and configuration. There is a small left-sided tentorial subdural hematoma measuring 1.4 x 1.0 cm which is not causing appreciable mass effect. There are no other extra-axial fluid collections. There is no intra-axial hemorrhage. There is no mass or midline shift. Gray-white compartments appear normal. No acute infarct evident. Vascular: There is minimal calcification in the cavernous carotid artery regions bilaterally. Skull: Bony calvarium appears intact. Sinuses/Orbits: Visualized orbits appear symmetric bilaterally. Visualized paranasal sinuses are clear. Other: Visualized mastoid air cells are clear. There is soft tissue injury in the left occipital region with air in the soft tissues. No fracture is evident in this region. IMPRESSION: Small left-sided tentorial subdural hematoma without appreciable mass effect. No focal gray-white compartment lesions. No acute infarct. No midline shift. Soft tissue injury left occipital scalp.  No fracture. Critical Value/emergent results were called by telephone at the time of interpretation on 06/26/2016 at 2:29 pm to [facility]KAYLA ROSE, PA , who verbally acknowledged these results. Electronically Signed   By: Bretta BangWilliam  Woodruff III M.D.   On: 06/26/2016 14:30   I have personally reviewed and evaluated these images and lab results as part of my medical  decision-making.   EKG Interpretation None        Ct scan no change to left subdural.   Pt given rx for hydrocodone and phenergan.  Pt counseled on follow up   Final diagnoses:  Acute post-traumatic headache, not intractable    Meds ordered this encounter  Medications  . DISCONTD: HYDROmorphone (DILAUDID) injection 1 mg    Sig:   . DISCONTD: ondansetron (ZOFRAN) injection 4 mg    Sig:   . fentaNYL (SUBLIMAZE) injection 50 mcg    Sig:   . ondansetron (ZOFRAN) injection 4 mg    Sig:   . HYDROcodone-acetaminophen (NORCO/VICODIN) 5-325 MG tablet    Sig: Take 2 tablets by mouth every 4 (four) hours as needed.    Dispense:  16 tablet    Refill:  0    Order Specific Question:  Supervising Provider    Answer:  MILLER, BRIAN [[id number]]  . promethazine (PHENERGAN) 25 MG tablet    Sig: Take 1 tablet (25 mg total) by mouth every 6 (six) hours as needed for nausea or vomiting.    Dispense:  12 tablet    Refill:  0    Order Specific Question:  Supervising Provider    Answer:  MILLER, BRIAN [[id number]]  . HYDROcodone-acetaminophen (NORCO/VICODIN) 5-325 MG per tablet 2 tablet    Sig:   . promethazine (PHENERGAN)  tablet 12.5 mg    Sig:       Lonia Skinner [place], PA-C 06/27/16 1552  Cathren Laine, MD 06/28/16 1000

## 2016-06-27 NOTE — Progress Notes (Signed)
Kathleen Robles is a 70 y.o. female patient. CD-IOP: Excused Absence. The patient was scheduled to begin the CD-IOP this afternoon. She phoned to report that she had fallen on Monday evening. She has 6 staples in her head and reported she is feeling nauseous and somewhat unstable and will not be coming in today. She reported that she has friends that are with her and her daughter is trying to get a flight from [place]. I stated that we would call later this week to check on her, but that she will be excused until she is feeling well enough to begin the program.        Ugochukwu Chichester, LCAS

## 2016-06-28 ENCOUNTER — Other Ambulatory Visit (HOSPITAL_COMMUNITY): Payer: Medicare Other | Attending: Psychiatry

## 2016-06-28 DIAGNOSIS — Z91410 Personal history of adult physical and sexual abuse: Secondary | ICD-10-CM | POA: Insufficient documentation

## 2016-06-28 DIAGNOSIS — F4312 Post-traumatic stress disorder, chronic: Secondary | ICD-10-CM | POA: Insufficient documentation

## 2016-06-28 DIAGNOSIS — I10 Essential (primary) hypertension: Secondary | ICD-10-CM | POA: Insufficient documentation

## 2016-06-28 DIAGNOSIS — F1020 Alcohol dependence, uncomplicated: Secondary | ICD-10-CM | POA: Insufficient documentation

## 2016-06-29 DIAGNOSIS — S0101XD Laceration without foreign body of scalp, subsequent encounter: Secondary | ICD-10-CM | POA: Diagnosis not present

## 2016-06-29 DIAGNOSIS — I6200 Nontraumatic subdural hemorrhage, unspecified: Secondary | ICD-10-CM | POA: Diagnosis not present

## 2016-07-02 ENCOUNTER — Other Ambulatory Visit (HOSPITAL_COMMUNITY): Payer: Medicare Other | Admitting: Psychology

## 2016-07-03 DIAGNOSIS — S0101XD Laceration without foreign body of scalp, subsequent encounter: Secondary | ICD-10-CM | POA: Diagnosis not present

## 2016-07-04 ENCOUNTER — Other Ambulatory Visit (HOSPITAL_COMMUNITY): Payer: Medicare Other | Admitting: Psychology

## 2016-07-04 ENCOUNTER — Other Ambulatory Visit (HOSPITAL_COMMUNITY): Payer: Self-pay | Admitting: Medical

## 2016-07-04 DIAGNOSIS — Z91410 Personal history of adult physical and sexual abuse: Secondary | ICD-10-CM | POA: Diagnosis not present

## 2016-07-04 DIAGNOSIS — F4312 Post-traumatic stress disorder, chronic: Secondary | ICD-10-CM | POA: Diagnosis not present

## 2016-07-04 DIAGNOSIS — I10 Essential (primary) hypertension: Secondary | ICD-10-CM | POA: Diagnosis not present

## 2016-07-04 DIAGNOSIS — F1020 Alcohol dependence, uncomplicated: Secondary | ICD-10-CM | POA: Diagnosis present

## 2016-07-05 ENCOUNTER — Other Ambulatory Visit (HOSPITAL_COMMUNITY): Payer: Medicare Other | Admitting: Psychology

## 2016-07-05 ENCOUNTER — Encounter (HOSPITAL_COMMUNITY): Payer: Self-pay | Admitting: Psychology

## 2016-07-05 DIAGNOSIS — F1020 Alcohol dependence, uncomplicated: Secondary | ICD-10-CM | POA: Insufficient documentation

## 2016-07-05 NOTE — Progress Notes (Signed)
    Daily Group Progress Note  Program: CD-IOP   Group Time: 1-2:30  Participation Level: Active  Behavioral Response: Appropriate and Sharing  Type of Therapy: Process Group  Topic: Counselors met with patients for group process session. Patients shared about their recovery from drugs and alcohol. Patients were active and engaged in group. 2 Patients graduated from group successfully with some family members and AA sponsors in attendance. One group member shared that he had not gone to any meetings in over a week and some group members confronted him about this issue.     Group Time: 2:45-4  Participation Level: Active  Behavioral Response: Appropriate and Sharing  Type of Therapy: Psycho-education Group  Topic: Counselors met with patients for group psychoeducation session. The topic was mindfulness in recovery with an emphasis on developing a non-judgmental stance and ability to be more present with their emotional experience. Counselor used a handout from the DBT skills workbook on developing "wise mind" in recovery. Patients were thoughtfully engaged and showed interest in the subject. Some patients felt overwhelmed by the "depth" of the subject but counselor encouraged them to practice mindfulness in simple terms.    Summary: Patient was very active and engaged in group. She presented as positive and upbeat. She reported that she attended 2 AA meetings since our last group. She was very open to giving members feedback but did so without judgment or advice-giving. She reports that she is feeling hopeful and left her first group last time feeling very good. She has a sponsor who she talks to daily and is working a Environmental education officer. Patient offered good feedback to another member who was struggling to attend AA. Sobriety date is 7/4. Kathleen Robles, Counselor   Family Program: Family present? No   Name of family member(s):   UDS collected: No Results: PEnding  AA/NA attended?:  YesWednesday and Thursday  Sponsor?: Yes   Kathleen Robles, LCAS

## 2016-07-06 ENCOUNTER — Encounter (HOSPITAL_COMMUNITY): Payer: Self-pay | Admitting: Psychology

## 2016-07-06 DIAGNOSIS — I6200 Nontraumatic subdural hemorrhage, unspecified: Secondary | ICD-10-CM | POA: Diagnosis not present

## 2016-07-06 DIAGNOSIS — S76312A Strain of muscle, fascia and tendon of the posterior muscle group at thigh level, left thigh, initial encounter: Secondary | ICD-10-CM | POA: Diagnosis not present
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
## 2016-07-06 NOTE — Progress Notes (Signed)
Kathleen Robles is a 70 y.o. female patient. CD-IOP: Treatment Plan. Met with the patient this morning as scheduled. She had just come from the 10 am AA meeting at the [facility]. The patient reported she is currently working with her temporary sponsor, who has 15+ years of sobriety. At my request, the patient reviewed how she had come to move to [facility] and her current relationships with her 3 children. Despite having moved here 5 years ago, she admitted she doesn't have any friends. 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 She admitted, when I noted this, that it really had been more than she could handle. But, in keeping with her caretaking codependent way of life, it was certainly understandable. The importance of identifying goals of treatment was reviewed and the client very agreeable to this process. She identified sobriety as the #1 goal of [place]. She also agreed that building support was essential. The patient admitted that, "I tried to do it myself for years", but she now fully accepted she must have support of others in recovery. When asked about any other goal, the patient identified addressing her compulsive caretaking as something she would like to address. The goal of becoming more embodied and attentive to her own feelings, needs and wants was identified as her 3rd treatment goal. We discussed more about her marriage and her husband's suicide in 2012. Fortunately, she never saw his body after he had blown his head off; the EMT had kept her from having to witness it. 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 She lost 30 lbs in less than 3 months and maintained her anorectic lifestyle throughout her college and early 20's. The patient noted her OB-GYN was surprised she was able to get pregnant  because she was so emaciated. The patient has returned to healthy eating, but admitted she has to be conscious of it even now. The session ended and the treatment plan was signed and completed accordingly. We will continue to work closely with this patient in the days and weeks ahead. Sobriety Date = 06/26/16.        Arlyss Weathersby, LCAS

## 2016-07-07 LAB — PRESCRIPTION ABUSE MONITORING 17P, URINE
6-Acetylmorphine, Urine: NEGATIVE ng/mL
Amphetamine Scrn, Ur: NEGATIVE ng/mL
BARBITURATE SCREEN URINE: NEGATIVE ng/mL
BENZODIAZEPINE SCREEN, URINE: NEGATIVE ng/mL
BUPRENORPHINE, URINE: NEGATIVE ng/mL
CANNABINOIDS UR QL SCN: NEGATIVE ng/mL
CARISOPRODOL/MEPROBAMATE, UR: NEGATIVE ng/mL
CREATININE(CRT), U: 29.8 mg/dL (ref 20.0–300.0)
Cocaine (Metab) Scrn, Ur: NEGATIVE ng/mL
EDDP, URINE: NEGATIVE ng/mL
FENTANYL, URINE: NEGATIVE pg/mL
MDMA Screen, Urine: NEGATIVE ng/mL
MEPERIDINE SCREEN, URINE: NEGATIVE ng/mL
METHADONE SCREEN, URINE: NEGATIVE ng/mL
NITRITE URINE, QUANTITATIVE: NEGATIVE ug/mL
OXYCODONE+OXYMORPHONE UR QL SCN: NEGATIVE ng/mL
Opiate Scrn, Ur: NEGATIVE ng/mL
PHENCYCLIDINE QUANTITATIVE URINE: NEGATIVE ng/mL
Ph of Urine: 5.7 (ref 4.5–8.9)
Propoxyphene Scrn, Ur: NEGATIVE ng/mL
SPECIFIC GRAVITY: 1.008
Tapentadol, Urine: NEGATIVE ng/mL
Tramadol Screen, Urine: NEGATIVE ng/mL

## 2016-07-07 LAB — ETHYL GLUCURONIDE, URINE: ETHYL GLUCURONIDE SCREEN, UR: NEGATIVE ng/mL

## 2016-07-09 ENCOUNTER — Other Ambulatory Visit (HOSPITAL_COMMUNITY): Payer: Medicare Other | Admitting: Psychology

## 2016-07-09 ENCOUNTER — Encounter (HOSPITAL_COMMUNITY): Payer: Self-pay | Admitting: Medical

## 2016-07-09 VITALS — BP 126/82 | HR 81 | Ht 64.5 in | Wt 116.2 lb

## 2016-07-09 DIAGNOSIS — F4312 Post-traumatic stress disorder, chronic: Secondary | ICD-10-CM

## 2016-07-09 DIAGNOSIS — S060X0A Concussion without loss of consciousness, initial encounter: Secondary | ICD-10-CM | POA: Insufficient documentation

## 2016-07-09 DIAGNOSIS — Z8659 Personal history of other mental and behavioral disorders: Secondary | ICD-10-CM

## 2016-07-09 DIAGNOSIS — S060X0D Concussion without loss of consciousness, subsequent encounter: Secondary | ICD-10-CM

## 2016-07-09 DIAGNOSIS — Z6372 Alcoholism and drug addiction in family: Secondary | ICD-10-CM | POA: Insufficient documentation

## 2016-07-09 DIAGNOSIS — I10 Essential (primary) hypertension: Secondary | ICD-10-CM | POA: Insufficient documentation

## 2016-07-09 DIAGNOSIS — F10280 Alcohol dependence with alcohol-induced anxiety disorder: Secondary | ICD-10-CM

## 2016-07-09 DIAGNOSIS — W101XXD Fall (on)(from) sidewalk curb, subsequent encounter: Secondary | ICD-10-CM | POA: Insufficient documentation

## 2016-07-09 DIAGNOSIS — F1020 Alcohol dependence, uncomplicated: Secondary | ICD-10-CM

## 2016-07-09 DIAGNOSIS — IMO0002 Reserved for concepts with insufficient information to code with codable children: Secondary | ICD-10-CM

## 2016-07-09 DIAGNOSIS — Z91411 Personal history of adult psychological abuse: Secondary | ICD-10-CM | POA: Insufficient documentation

## 2016-07-09 DIAGNOSIS — Z91410 Personal history of adult physical and sexual abuse: Secondary | ICD-10-CM | POA: Insufficient documentation

## 2016-07-09 DIAGNOSIS — F10982 Alcohol use, unspecified with alcohol-induced sleep disorder: Secondary | ICD-10-CM | POA: Insufficient documentation

## 2016-07-09 DIAGNOSIS — F10239 Alcohol dependence with withdrawal, unspecified: Secondary | ICD-10-CM | POA: Insufficient documentation

## 2016-07-09 MED ORDER — SERTRALINE HCL 50 MG PO TABS: 50.0000 mg | ORAL_TABLET | Freq: Every day | ORAL | Status: DC

## 2016-07-09 MED ORDER — TRAZODONE HCL 100 MG PO TABS: 100.0000 mg | ORAL_TABLET | Freq: Every evening | ORAL | Status: DC | PRN
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
## 2016-07-10 ENCOUNTER — Encounter (HOSPITAL_COMMUNITY): Payer: Self-pay | Admitting: Psychology

## 2016-07-10 NOTE — Progress Notes (Signed)
Daily Group Progress Note  Program: CD-IOP   Group Time: 1-2:30  Participation Level: Active  Behavioral Response: Appropriate and Sharing  Type of Therapy: Process Group  Topic: Patients presented for group process session. Topics included patients' sobriety, recovery efforts, and any challenges they faced over the weekend. Counselors used open questions and MI to evoke "change talk", grow motivation, and help patients explore their affective experience. Drugs tests were collected from some individuals. One patient met with Darlyne Russian, PA, program director to establish care.     Group Time: 2:45-4  Participation Level: Active  Behavioral Response: Appropriate and Sharing  Type of Therapy: Psycho-education Group  Topic: Patients presented for group psychoeducation session. Topics included "body, mind, spirit connection" with an emphasis on spirituality in recovery. Patients explored their spiritual connection including soul, religious experience, and purpose with a recovery-based discussion. Patients were active and engaged.   Summary: Patient presented for group session today and was active and engaged. She reported that she attended 3 AA meetings over the weekend. Patient has started to follow through on recommendations by tx team to rediscover old hobbies and interests. She is gardening, puzzling, and cooking more than when she was actively drinking. Pt is establishing boundaries with her friends by negotiating trips more in her scheduling favor and not "caving to their desires every time." She shared that she "aggrivated her hamstring" due to being too active. Patient is making good progress in early recovery but needs to begin her step work and has yet to explore any of the emotional or psychological reasons she became an alcoholic. Sobriety date remains 7/4. Youlanda Roys, Counselor.   Family Program: Family present? No   Name of family member(s):   UDS collected: No  Results: negative  AA/NA attended?: YesFriday and Saturday  Sponsor?: Yes   Linkoln Alkire, LCAS
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
## 2016-07-11 ENCOUNTER — Other Ambulatory Visit (HOSPITAL_COMMUNITY): Payer: Medicare Other | Admitting: Psychology

## 2016-07-11 DIAGNOSIS — F1020 Alcohol dependence, uncomplicated: Secondary | ICD-10-CM | POA: Diagnosis not present

## 2016-07-11 DIAGNOSIS — F10280 Alcohol dependence with alcohol-induced anxiety disorder: Secondary | ICD-10-CM

## 2016-07-11 DIAGNOSIS — F10239 Alcohol dependence with withdrawal, unspecified: Secondary | ICD-10-CM

## 2016-07-12 ENCOUNTER — Other Ambulatory Visit (HOSPITAL_COMMUNITY): Payer: Medicare Other | Admitting: Psychology

## 2016-07-12 DIAGNOSIS — F1020 Alcohol dependence, uncomplicated: Secondary | ICD-10-CM | POA: Diagnosis not present

## 2016-07-12 DIAGNOSIS — F10280 Alcohol dependence with alcohol-induced anxiety disorder: Secondary | ICD-10-CM

## 2016-07-12 DIAGNOSIS — F10239 Alcohol dependence with withdrawal, unspecified: Secondary | ICD-10-CM

## 2016-07-13 ENCOUNTER — Encounter (HOSPITAL_COMMUNITY): Payer: Self-pay | Admitting: Psychology
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
## 2016-07-15 NOTE — Progress Notes (Signed)
    Daily Group Progress Note  Program: CD-IOP   Group Time: 1-2:30 pm  Participation Level: Active  Behavioral Response: Appropriate  Type of Therapy: Process Group  Topic: Process: the first half of group was spent in process. Members identified everything they had done to support their recovery since we met yesterday. There was good exchange and feedback among members. One member struggled with identifying his feelings so the "Feeling Wheel" was passed around with members identifying their feelings. This counselor reminded the group that she and her co-therapist are doing everything they can to help each group member in developing strategies and skills in order that they will keep their addictions in remission. The question I asked was, "are you, in turn, doing everything you can to learn how to manage your disease"?  Group Time: 2:45- 4pm  Participation Level: Active  Behavioral Response: Appropriate  Type of Therapy: Psycho-education Group  Topic: Psycho-Ed: "Self-Care". The second half of group was spent in a psycho-ed. The topic was self-care and the importance of a balanced life in early recovery was emphasized. Critical issues like diet, sleep and exercise were discussed and some healthy parameters provided in each instance. As the session neared an end, members shared about their plans for the upcoming weekend. One member reminded his fellow group members that "If you don't have a plan, you have got a plan to drink".  Summary: Sobriety date = 7/4. The patient reported she had attended the 10 am women's meeting this morning. When asked what she was feeling, the patient identified, "peaceful and optimistic". She shared that she had met with her sponsor and is working Step 4. Her sponsor complimented her on how she feels as if she has really 'surrendered and is willing'. This comment clearly meant a lot to the patient. The patient reported developing a routine is something she is  working on and admitted she never really had a set routine in her life other than going to work. She explained she finds it helpful to spend part of her morning in prayer and meditation, as she had today. Her son phoned her and asked her to pick out a color for the stain for the deck. He is going to work on it over the next couple of weeks. The patient was very pleased that he had phoned her and was going to be spending some time at her house. They have had some very significant problems, partly because of her alcoholism.  The patient reported she is sleeping well and she is very attentive to her food intake. She explained that she had been anorexic for over 10 years and she works hard to insure good nutrition. She exercises almost daily, but is currently receiving treatment for a strained hamstring. The patient is doing everything we have asked of her and she responded well to this intervention.    Family Program: Family present? No   Name of family member(s):   UDS collected: No Results:   AA/NA attended?: YesThursday  Sponsor?: Yes   Keighley Deckman, LCAS
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
## 2016-07-16 ENCOUNTER — Other Ambulatory Visit (HOSPITAL_COMMUNITY): Payer: Medicare Other | Admitting: Psychology

## 2016-07-16 ENCOUNTER — Other Ambulatory Visit (HOSPITAL_COMMUNITY): Payer: Self-pay | Admitting: Medical

## 2016-07-16 DIAGNOSIS — F1020 Alcohol dependence, uncomplicated: Secondary | ICD-10-CM

## 2016-07-17 NOTE — Progress Notes (Signed)
Daily Group Progress Note  Program: CD-IOP   Group Time: 1-2:30 pm  Participation Level: Active  Behavioral Response: Sharing  Type of Therapy: Process Group  Topic: Process: the first half of group was spent in process. Members shared about the past weekend and the things that they had done to support their sobriety as well as any challenges that presented themselves.  One member arrived a few minutes late and appeared out of sorts. He checked-in with a sobriety date of today. He was asked about this and he reported one of 'my homeboys' is in the hospital after suffering a stroke. He is in ICU and it doesn't look good. He reported he needed to be there with his friend and couldn't stay. He got up and left. Drug tests were collected from all group members today and the medical director met with the graduating member.   Group Time: 2:45-4 pm  Participation Level: Active  Behavioral Response: Sharing  Type of Therapy: Psycho-education Group  Topic: Psycho-Ed: Bio-Psycho-Social-Spiritual/Graduation. The second half of group was spent in a psycho-ed on the 4 different elements that lead to addiction. These different elements were discussed in detail. It was noted that the only one that is genetically-based was the biological element. The characteristics that might be displayed based were identified. Members followed up by identifying the ways one in recovery could contribute to strengthening these elements.  As the session neared an end, a graduation ceremony was held honoring a Software engineer. Her husband was brought in to sit with her during the ceremony. Kind words of hope, admiration and courage were shared. The patient shared her hopes for her fellow group members and thanked everyone for helping her along the way.   Summary: Sobriety date = 7/4. The patient reported she had attended 4 AA meetings since we last met. She had been quite busy receiving treatment for her pulled  hamstring as well as being in the pool for further repair it as she has been instructed. She reported that she has been practicing the guided meditations every morning and she noted that she is getting better at it every day she practices. The patient reported really enjoying this new routine. She also reads scripture and other faith-based works in addition to [employer] and information. She apologized for 'yawning a lot' and reported she is not sleepy and can't figure out why she is doing this? She had also met with her sponsor and spent 2 hours on step work. In the psycho-ed, the patient described growing up in an extended family where alcohol was ubiquitous and she could drink wine when she was 70 yo. The patient reported she assumed this was the way it was with everyone. The patient reported she had never seen her grandmother or aunt drunk, but they were all alcoholics, but perhaps their behaviors was so common that she just didn't know to see it. She had also been taught early on to take care of her mother, who was often hospitalized because of a serious asthma condition. She always wanted "her daughter with her". It is now clear to this patient that she was "taught to be a caretaker" from a very early age. During the graduation, the patient expressed how pleased she was to have met the graduating member and their plans for meetings and lunch going forward. The patient provided honest feedback and she responded well to this intervention. She is making satisfactory progress in early recovery and is making significant changes  in her daily life in ways that support her recovery. We will continue to follow closely in the days ahead.    Family Program: Family present? No   Name of family member(s):   UDS collected: Yes Results: pending  AA/NA attended?: YesMonday, Friday and Sunday  Sponsor?: Yes   Brandon Melnick, LCAS

## 2016-07-18 ENCOUNTER — Other Ambulatory Visit (HOSPITAL_COMMUNITY): Payer: Medicare Other | Admitting: Psychology

## 2016-07-18 DIAGNOSIS — F1020 Alcohol dependence, uncomplicated: Secondary | ICD-10-CM | POA: Diagnosis not present

## 2016-07-18 DIAGNOSIS — F10280 Alcohol dependence with alcohol-induced anxiety disorder: Secondary | ICD-10-CM

## 2016-07-18 DIAGNOSIS — F10239 Alcohol dependence with withdrawal, unspecified: Secondary | ICD-10-CM

## 2016-07-19 ENCOUNTER — Other Ambulatory Visit (HOSPITAL_COMMUNITY): Payer: Medicare Other | Admitting: Psychology

## 2016-07-19 DIAGNOSIS — F10280 Alcohol dependence with alcohol-induced anxiety disorder: Secondary | ICD-10-CM

## 2016-07-19 DIAGNOSIS — F1020 Alcohol dependence, uncomplicated: Secondary | ICD-10-CM

## 2016-07-19 DIAGNOSIS — F10239 Alcohol dependence with withdrawal, unspecified: Secondary | ICD-10-CM

## 2016-07-19 LAB — PRESCRIPTION ABUSE MONITORING 17P, URINE
6-Acetylmorphine, Urine: NEGATIVE ng/mL
Amphetamine Scrn, Ur: NEGATIVE ng/mL
BARBITURATE SCREEN URINE: NEGATIVE ng/mL
BENZODIAZEPINE SCREEN, URINE: NEGATIVE ng/mL
BUPRENORPHINE, URINE: NEGATIVE ng/mL
CANNABINOIDS UR QL SCN: NEGATIVE ng/mL
COCAINE(METAB.)SCREEN, URINE: NEGATIVE ng/mL
Carisoprodol/Meprobamate, Ur: NEGATIVE ng/mL
Creatinine(Crt), U: 36.1 mg/dL (ref 20.0–300.0)
EDDP, Urine: NEGATIVE ng/mL
FENTANYL, URINE: NEGATIVE pg/mL
MDMA Screen, Urine: NEGATIVE ng/mL
MEPERIDINE SCREEN, URINE: NEGATIVE ng/mL
METHADONE SCREEN, URINE: NEGATIVE ng/mL
Nitrite Urine, Quantitative: NEGATIVE ug/mL
OXYCODONE+OXYMORPHONE UR QL SCN: NEGATIVE ng/mL
Opiate Scrn, Ur: NEGATIVE ng/mL
PH UR, DRUG SCRN: 5.5 (ref 4.5–8.9)
Phencyclidine Qn, Ur: NEGATIVE ng/mL
Propoxyphene Scrn, Ur: NEGATIVE ng/mL
SPECIFIC GRAVITY: 1.008
TRAMADOL SCREEN, URINE: NEGATIVE ng/mL
Tapentadol, Urine: NEGATIVE ng/mL

## 2016-07-19 LAB — ETHYL GLUCURONIDE, URINE: ETHYL GLUCURONIDE SCREEN, UR: NEGATIVE ng/mL

## 2016-07-20 DIAGNOSIS — Z22322 Carrier or suspected carrier of Methicillin resistant Staphylococcus aureus: Secondary | ICD-10-CM | POA: Diagnosis not present

## 2016-07-20 NOTE — Progress Notes (Signed)
Daily Group Progress Note  Program: CD-IOP   07/20/2016 [name] [id number] _0 @  Sobriety Date: 06-26-16  Patient met with Medical Director: (Y/N) n  Group Time: 1-2:30pm  Participation Level: Active  Behavioral Response: Appropriate, Sharing and Assertive  Type of Therapy: Process Group  Interventions: CBT and Motivational Interviewing  Topic: The group used the first half of group to process events that occurred outside of group related to recovery.  The counselor and members were able to provide feedback to individuals struggling with certain aspects of sobriety.     Group Time: 2:45-4pm  Participation Level: Active  Behavioral Response: Appropriate, Sharing and Assertive  Type of Therapy: Psycho-education Group  Interventions: Other: Family Sculpture  Topic: The second half of group focused on psychoeducation.  The group employed a psychodrama technique, Family Sculpting, to discuss families and family structures.  Each member is provided the opportunity to share a pivotal moment related to their own family through sculpting group members to reenact the scene.  This allows the individual, as well as the other members, to discuss the dynamics of the family, feelings associated with the scene, and how the moment is part of their addiction.    Summary:  Client attended four AA meetings since the last group session.  She shared she had a "good conversation" with her son who has been semi-estranged from her due to her addiction.  She continues to remind herself, and her family, that she is not living in the past any longer, but does accept responsibility for her actions while she was in active addiction.  Client continues to work on her ability to ask and receive help from others.  Counselor discussed being authentic to oneself, and not wearing a mask to make others feel better or disguise her own pain or cravings.  Client shared she is "trying to find herself"  by actually working the program instead of pretending. Youlanda Roys, Counselor   UDS collected: No Results: negative  AA/NA attended?: YesMonday and Tuesday  Sponsor?: Yes   Brandon Melnick, LCAS 07/20/2016 10:59 AM

## 2016-07-23 ENCOUNTER — Other Ambulatory Visit (HOSPITAL_COMMUNITY): Payer: Medicare Other | Admitting: Psychology

## 2016-07-23 DIAGNOSIS — F10239 Alcohol dependence with withdrawal, unspecified: Secondary | ICD-10-CM

## 2016-07-23 DIAGNOSIS — F10280 Alcohol dependence with alcohol-induced anxiety disorder: Secondary | ICD-10-CM

## 2016-07-23 DIAGNOSIS — F1020 Alcohol dependence, uncomplicated: Secondary | ICD-10-CM

## 2016-07-23 NOTE — Progress Notes (Signed)
    Daily Group Progress Note  Program: CD-IOP   07/23/2016 Kathleen Robles [id number] '@[id number]'$ @  Sobriety Date: 06/26/2016  Patient met with Medical Director: (Y/N) N  Group Time: 1-2:30  Participation Level: Active  Behavioral Response: Appropriate, Sharing and Intrusive  Type of Therapy: Process Group  Interventions: CBT, Motivational Interviewing and Solution Focused  Topic: The group used the first half of group to process events that occurred outside of group related to recovery.  The counselor and members were able to provide feedback to individuals struggling with certain aspects of sobriety.   Group Time: 2:45-4  Participation Level: Active  Behavioral Response: Appropriate, Sharing and Intrusive  Type of Therapy: Psycho-education Group  Interventions: Other: Family Sculpture  Topic: The second half of group focused on psycho-education.  The group employed a psychodrama technique, Family Sculpting, to discuss families and family structures.  Each member is provided the opportunity to share a pivotal moment related to their own family through sculpting group members to reenact the scene.  This allows the individual, as well as the other members, to discuss the dynamics of the family, feelings associated with the scene, and how the moment is part of their addiction.   Summary:  Patient stated that she had attended one meeting, met with her sponsor, and is working on Step 4. Patient stated that she is incorporating meditation/prayer into her life on a daily basis. Patient shared that she had a very encouraging visit with her son, and that she enjoyed spending time with her family. The counselor asked her to share what she is learning about her emotional/physical/spiritual self. Patient remained very superficial, and avoids talking about herself. Kathleen Robles, Counselor   UDS collected: No Results: negative  AA/NA attended?: YesWednesday  Sponsor?: Yes   Kathleen Robles,  LCAS 07/23/2016 10:10 AM
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
## 2016-07-25 ENCOUNTER — Other Ambulatory Visit (HOSPITAL_COMMUNITY): Payer: Medicare Other | Attending: Psychiatry | Admitting: Psychology

## 2016-07-25 ENCOUNTER — Other Ambulatory Visit (HOSPITAL_COMMUNITY): Payer: Self-pay | Admitting: Medical

## 2016-07-25 DIAGNOSIS — F10280 Alcohol dependence with alcohol-induced anxiety disorder: Secondary | ICD-10-CM

## 2016-07-25 DIAGNOSIS — F419 Anxiety disorder, unspecified: Secondary | ICD-10-CM | POA: Diagnosis not present

## 2016-07-25 DIAGNOSIS — F1020 Alcohol dependence, uncomplicated: Secondary | ICD-10-CM | POA: Diagnosis present

## 2016-07-25 DIAGNOSIS — Z91410 Personal history of adult physical and sexual abuse: Secondary | ICD-10-CM | POA: Diagnosis not present

## 2016-07-25 DIAGNOSIS — F10239 Alcohol dependence with withdrawal, unspecified: Secondary | ICD-10-CM

## 2016-07-25 DIAGNOSIS — F4312 Post-traumatic stress disorder, chronic: Secondary | ICD-10-CM | POA: Diagnosis not present

## 2016-07-26 ENCOUNTER — Other Ambulatory Visit (HOSPITAL_COMMUNITY): Payer: Medicare Other | Admitting: Psychology

## 2016-07-26 DIAGNOSIS — F4312 Post-traumatic stress disorder, chronic: Secondary | ICD-10-CM

## 2016-07-26 DIAGNOSIS — F1020 Alcohol dependence, uncomplicated: Secondary | ICD-10-CM

## 2016-07-26 DIAGNOSIS — F10280 Alcohol dependence with alcohol-induced anxiety disorder: Secondary | ICD-10-CM

## 2016-07-26 DIAGNOSIS — F10239 Alcohol dependence with withdrawal, unspecified: Secondary | ICD-10-CM
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
## 2016-07-27 LAB — PRESCRIPTION ABUSE MONITORING 17P, URINE
6-ACETYLMORPHINE, URINE: NEGATIVE ng/mL
AMPHETAMINE SCREEN URINE: NEGATIVE ng/mL
BARBITURATE SCREEN URINE: NEGATIVE ng/mL
BENZODIAZEPINE SCREEN, URINE: NEGATIVE ng/mL
BUPRENORPHINE, URINE: NEGATIVE ng/mL
CANNABINOIDS UR QL SCN: NEGATIVE ng/mL
CARISOPRODOL/MEPROBAMATE, UR: NEGATIVE ng/mL
COCAINE(METAB.)SCREEN, URINE: NEGATIVE ng/mL
CREATININE(CRT), U: 62.4 mg/dL (ref 20.0–300.0)
EDDP, Urine: NEGATIVE ng/mL
Fentanyl, Urine: NEGATIVE pg/mL
MDMA SCREEN, URINE: NEGATIVE ng/mL
METHADONE SCREEN, URINE: NEGATIVE ng/mL
Meperidine Screen, Urine: NEGATIVE ng/mL
Nitrite Urine, Quantitative: NEGATIVE ug/mL
OPIATE SCREEN URINE: NEGATIVE ng/mL
OXYCODONE+OXYMORPHONE UR QL SCN: NEGATIVE ng/mL
PHENCYCLIDINE QUANTITATIVE URINE: NEGATIVE ng/mL
Ph of Urine: 5.5 (ref 4.5–8.9)
Propoxyphene Scrn, Ur: NEGATIVE ng/mL
SPECIFIC GRAVITY: 1.016
TAPENTADOL, URINE: NEGATIVE ng/mL
Tramadol Screen, Urine: NEGATIVE ng/mL

## 2016-07-27 LAB — ETHYL GLUCURONIDE, URINE: ETHYL GLUCURONIDE SCREEN, UR: NEGATIVE ng/mL

## 2016-07-30 ENCOUNTER — Other Ambulatory Visit (HOSPITAL_COMMUNITY): Payer: Medicare Other | Admitting: Psychology

## 2016-07-30 ENCOUNTER — Other Ambulatory Visit (HOSPITAL_COMMUNITY): Payer: Self-pay | Admitting: Medical

## 2016-07-30 DIAGNOSIS — F1020 Alcohol dependence, uncomplicated: Secondary | ICD-10-CM | POA: Diagnosis not present

## 2016-07-30 DIAGNOSIS — F10280 Alcohol dependence with alcohol-induced anxiety disorder: Secondary | ICD-10-CM

## 2016-07-30 DIAGNOSIS — F10239 Alcohol dependence with withdrawal, unspecified: Secondary | ICD-10-CM

## 2016-07-31 ENCOUNTER — Encounter (HOSPITAL_COMMUNITY): Payer: Self-pay | Admitting: Psychology
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
## 2016-08-01 ENCOUNTER — Encounter (HOSPITAL_COMMUNITY): Payer: Self-pay | Admitting: Psychology

## 2016-08-01 ENCOUNTER — Other Ambulatory Visit (HOSPITAL_COMMUNITY): Payer: Medicare Other | Admitting: Psychology

## 2016-08-01 DIAGNOSIS — F1020 Alcohol dependence, uncomplicated: Secondary | ICD-10-CM | POA: Diagnosis not present

## 2016-08-01 NOTE — Progress Notes (Signed)
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 Patients shared about their cravings, thoughts, and challenges to sobriety. They shared about their treatment goals progress and how they were supporting their recovery. One drug test was collected.   Group Time: 2:45- 4pm  Participation Level: Active  Behavioral Response: Sharing  Type of Therapy: Psycho-education Group  Interventions: Social Skills Training  Topic: Psycho-Education:  Patients met with counselors for a psychoeducation-oriented discussion about the Johari Window. Counselors encouraged patients to increase their public/arena square and patients shared about the dangers of trusting others. Counselor reframed patients' hesitations as risks necessary to authenticity and relationships. Patients participated in an art therapy exercise involving a Paper Mache mask and the "2 sides of their personality". Patients were active and engaged. The session ended with a graduation. Members enjoyed brownies and shared their feelings of hope and admiration for the graduating member.   Summary: The patient reported she had met with her sponsor last night and they continue to focus on her 4th step. She attended an AA meeting this morning. The patient admitted sometimes she thinks about things and wonders whether her entire life has been a waste? She is enjoying her new morning routine of reading,  reflecting and meditating the first thing in the morning. She finds it sets the tone for the remainder of her day. The patient reported she is spending more time with the TV off. She accepts that the quiet allows her to be more aware of what she is feeling and that is what she has tried to avoid for much of her life. "Helping others has been my way to hide", she noted. In the psycho-ed, the patient explained that her mask included a sunny happy face and one of enjoying life and being a good friend. But her inner mask included: fear and loneliness as well as wanting to be accepted. It is beneficial for other group members. Especially younger ones to witness that people struggle with their sense of self and purpose in life at any age. The patient learned to be a people pleaser at a very early age and has, up until now at the age of 69, continued to please others and her own expense. She shared words of hope and admiration for the graduating member as well as acknowledging her optimism and kindness. The patient will pick up her 30 day chip tomorrow. She responded well to this intervention.    UDS collected: No Results:  AA/NA attended?: YesThursday  Sponsor?: Yes    , LCAS  

## 2016-08-02 ENCOUNTER — Other Ambulatory Visit (HOSPITAL_COMMUNITY): Payer: Medicare Other | Admitting: Psychology

## 2016-08-02 DIAGNOSIS — F1020 Alcohol dependence, uncomplicated: Secondary | ICD-10-CM

## 2016-08-02 DIAGNOSIS — F4312 Post-traumatic stress disorder, chronic: Secondary | ICD-10-CM

## 2016-08-02 DIAGNOSIS — IMO0002 Reserved for concepts with insufficient information to code with codable children: Secondary | ICD-10-CM

## 2016-08-02 DIAGNOSIS — Z91410 Personal history of adult physical and sexual abuse: Secondary | ICD-10-CM

## 2016-08-03 LAB — PRESCRIPTION ABUSE MONITORING 17P, URINE
6-ACETYLMORPHINE, URINE: NEGATIVE ng/mL
Amphetamine Scrn, Ur: NEGATIVE ng/mL
BARBITURATE SCREEN URINE: NEGATIVE ng/mL
BENZODIAZEPINE SCREEN, URINE: NEGATIVE ng/mL
BUPRENORPHINE, URINE: NEGATIVE ng/mL
CANNABINOIDS UR QL SCN: NEGATIVE ng/mL
CARISOPRODOL/MEPROBAMATE, UR: NEGATIVE ng/mL
COCAINE(METAB.)SCREEN, URINE: NEGATIVE ng/mL
CREATININE(CRT), U: 32 mg/dL (ref 20.0–300.0)
EDDP, Urine: NEGATIVE ng/mL
Fentanyl, Urine: NEGATIVE pg/mL
MDMA Screen, Urine: NEGATIVE ng/mL
MEPERIDINE SCREEN, URINE: NEGATIVE ng/mL
METHADONE SCREEN, URINE: NEGATIVE ng/mL
Nitrite Urine, Quantitative: NEGATIVE ug/mL
OPIATE SCREEN URINE: NEGATIVE ng/mL
OXYCODONE+OXYMORPHONE UR QL SCN: NEGATIVE ng/mL
PHENCYCLIDINE QUANTITATIVE URINE: NEGATIVE ng/mL
Ph of Urine: 5.8 (ref 4.5–8.9)
Propoxyphene Scrn, Ur: NEGATIVE ng/mL
SPECIFIC GRAVITY: 1.012
TAPENTADOL, URINE: NEGATIVE ng/mL
Tramadol Screen, Urine: NEGATIVE ng/mL

## 2016-08-03 LAB — SPECIMEN STATUS REPORT

## 2016-08-03 LAB — ETHYL GLUCURONIDE, URINE: Ethyl Glucuronide Screen, Ur: NEGATIVE ng/mL

## 2016-08-06 ENCOUNTER — Other Ambulatory Visit (HOSPITAL_COMMUNITY): Payer: Self-pay | Admitting: Medical

## 2016-08-06 ENCOUNTER — Other Ambulatory Visit (HOSPITAL_COMMUNITY): Payer: Medicare Other | Admitting: Psychology

## 2016-08-06 DIAGNOSIS — F10280 Alcohol dependence with alcohol-induced anxiety disorder: Secondary | ICD-10-CM

## 2016-08-06 DIAGNOSIS — F1020 Alcohol dependence, uncomplicated: Secondary | ICD-10-CM

## 2016-08-06 DIAGNOSIS — F10239 Alcohol dependence with withdrawal, unspecified: Secondary | ICD-10-CM

## 2016-08-06 DIAGNOSIS — F4312 Post-traumatic stress disorder, chronic: Secondary | ICD-10-CM

## 2016-08-07 ENCOUNTER — Encounter (HOSPITAL_COMMUNITY): Payer: Self-pay | Admitting: Psychology
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
## 2016-08-08 ENCOUNTER — Other Ambulatory Visit (HOSPITAL_COMMUNITY): Payer: Medicare Other | Admitting: Psychology

## 2016-08-08 DIAGNOSIS — F4312 Post-traumatic stress disorder, chronic: Secondary | ICD-10-CM

## 2016-08-08 DIAGNOSIS — S060X0D Concussion without loss of consciousness, subsequent encounter: Secondary | ICD-10-CM

## 2016-08-08 DIAGNOSIS — F1020 Alcohol dependence, uncomplicated: Secondary | ICD-10-CM

## 2016-08-08 DIAGNOSIS — Z91410 Personal history of adult physical and sexual abuse: Secondary | ICD-10-CM

## 2016-08-08 LAB — PRESCRIPTION ABUSE MONITORING 17P, URINE
6-ACETYLMORPHINE, URINE: NEGATIVE ng/mL
Amphetamine Scrn, Ur: NEGATIVE ng/mL
BARBITURATE SCREEN URINE: NEGATIVE ng/mL
BENZODIAZEPINE SCREEN, URINE: NEGATIVE ng/mL
Buprenorphine, Urine: NEGATIVE ng/mL
CANNABINOIDS UR QL SCN: NEGATIVE ng/mL
CARISOPRODOL/MEPROBAMATE, UR: NEGATIVE ng/mL
Cocaine (Metab) Scrn, Ur: NEGATIVE ng/mL
Creatinine(Crt), U: 21.3 mg/dL (ref 20.0–300.0)
EDDP, URINE: NEGATIVE ng/mL
Fentanyl, Urine: NEGATIVE pg/mL
MDMA Screen, Urine: NEGATIVE ng/mL
MEPERIDINE SCREEN, URINE: NEGATIVE ng/mL
Methadone Screen, Urine: NEGATIVE ng/mL
NITRITE URINE, QUANTITATIVE: NEGATIVE ug/mL
OXYCODONE+OXYMORPHONE UR QL SCN: NEGATIVE ng/mL
Opiate Scrn, Ur: NEGATIVE ng/mL
PH UR, DRUG SCRN: 6 (ref 4.5–8.9)
PROPOXYPHENE SCREEN URINE: NEGATIVE ng/mL
Phencyclidine Qn, Ur: NEGATIVE ng/mL
SPECIFIC GRAVITY: 1.005
TRAMADOL SCREEN, URINE: NEGATIVE ng/mL
Tapentadol, Urine: NEGATIVE ng/mL

## 2016-08-08 LAB — ETHYL GLUCURONIDE, URINE: Ethyl Glucuronide Screen, Ur: NEGATIVE ng/mL

## 2016-08-09 ENCOUNTER — Other Ambulatory Visit (HOSPITAL_COMMUNITY): Payer: Medicare Other | Admitting: Psychology

## 2016-08-09 DIAGNOSIS — F10280 Alcohol dependence with alcohol-induced anxiety disorder: Secondary | ICD-10-CM

## 2016-08-09 DIAGNOSIS — F1020 Alcohol dependence, uncomplicated: Secondary | ICD-10-CM | POA: Diagnosis not present

## 2016-08-09 DIAGNOSIS — F4312 Post-traumatic stress disorder, chronic: Secondary | ICD-10-CM

## 2016-08-09 DIAGNOSIS — F10239 Alcohol dependence with withdrawal, unspecified: Secondary | ICD-10-CM

## 2016-08-09 NOTE — Progress Notes (Signed)
    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 Patients were active and engaged. Counselor passed out the "feelings wheel" and ask each pt to identify their current feeling. One drug test was collected. One group member successfully graduated from the program. Counselors administered a daily self inventory, PHQ-9, and GAD-7 for all patients. One patient received an RAADS-14. Counselor showed a video about being open about recovery and addiction. A routine fire drill took place around 2pm and all members were cooperative and compliant with the plan of action.     Group Time: 2:45-4  Participation Level: Active  Behavioral Response: Appropriate and Sharing  Type of Therapy: Psycho-education Group  Interventions: Solution Focused, Assertiveness Training and Psychosocial Skills: Refusal Skills  Topic: Counselors met with patients for group psychoeducation session which focused on deepening patient's understanding of refusal skills and how to avoid relapsing. Patients shared about their external triggers and the ways that they could potentially put themselves in high risk situations. All patients were active and engaged in session.   Summary: Patient presented for group with somewhat more upbeat affect and more engagement than recent sessions. She shared that she attended 1 AA meeting but was somewhat annoyed by an attendee that was being  loud and interrupting. Patient stated that she "took the anger home" and dwelled on thoughts of relapsing and tried to rationalize her drinking "just one". Patient shared openly and with seeming honesty about reaching out to her sponsor last night, and "utilizing many tools from group to battle a craving that lasted virtually all night". Patient shared about ignoring her feelings her whole life and how she is paying more attention to her emotional state day-to-day. Youlanda Roys, Counselor   UDS collected: No Results: negative  AA/NA attended?: YesWednesday  Sponsor?: Yes   Brandon Melnick, [place] 08/09/2016 5:05 PM

## 2016-08-12 ENCOUNTER — Encounter (HOSPITAL_COMMUNITY): Payer: Self-pay | Admitting: Psychology
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
## 2016-08-13 ENCOUNTER — Other Ambulatory Visit (HOSPITAL_COMMUNITY): Payer: Medicare Other | Admitting: Psychology

## 2016-08-13 DIAGNOSIS — F4312 Post-traumatic stress disorder, chronic: Secondary | ICD-10-CM

## 2016-08-13 DIAGNOSIS — Z91410 Personal history of adult physical and sexual abuse: Secondary | ICD-10-CM

## 2016-08-13 DIAGNOSIS — F1020 Alcohol dependence, uncomplicated: Secondary | ICD-10-CM

## 2016-08-13 MED ORDER — ACAMPROSATE CALCIUM 333 MG PO TBEC: 666.0000 mg | DELAYED_RELEASE_TABLET | Freq: Three times a day (TID) | ORAL | 2 refills | Status: AC

## 2016-08-13 MED ORDER — TRAZODONE HCL 150 MG PO TABS: 150.0000 mg | ORAL_TABLET | Freq: Every day | ORAL | 2 refills | Status: DC

## 2016-08-13 NOTE — Progress Notes (Incomplete)
    Daily Group Progress Note  Program: CD-IOP   08/13/2016 Kathleen Robles [id number]  Diagnosis: F10.20 Alcohol Use Disorder, Severe   Sobriety Date: 06/26/16  Group Time: 1-2:30 pm  Participation Level: Active  Behavioral Response: Appropriate and Sharing  Type of Therapy: Process Group  Interventions: Strength-based  Topic: Process: the first part of group was spent in process. Members shared about struggles or challenges to their recovery as well as what they had done since we last met, to remain abstinent. Two new group members were present and they shared a little bit about themselves in this half of group. They also identified what they needed going forward. The program director met with a member who is graduating tomorrow as well as one of the new members. One member was absent, but he had a court date and was excused.  Group Time: 2:45-4pm  Participation Level: Minimal  Behavioral Response: Sharing  Type of Therapy: Psycho-education Group  Interventions: CBT  Topic: Psycho-Ed: the second half of group was spent in a psycho-ed. The topic was "Relapse Prevention" and represented the second half of the psycho-ed first offered on Monday. The focus was on 'internal' triggers and members identified some internal triggers. Utilizing CBT strategies and skills to address these triggers were discussed at length. One member shared about the loneliness he has experienced since he got sober and the way he has been excluded from events that he would previously have been included. This disclosure invited other members to share about their own loneliness and isolation.    Summary:    UDS collected: No Results:   AA/NA attended?: YesMonday, Tuesday and Wednesday  Sponsor?: Yes   Brandon Melnick, LCAS 08/13/2016 8:36 AM

## 2016-08-15 ENCOUNTER — Other Ambulatory Visit (HOSPITAL_COMMUNITY): Payer: Medicare Other | Admitting: Psychology

## 2016-08-15 DIAGNOSIS — F4312 Post-traumatic stress disorder, chronic: Secondary | ICD-10-CM

## 2016-08-15 DIAGNOSIS — F1020 Alcohol dependence, uncomplicated: Secondary | ICD-10-CM

## 2016-08-15 DIAGNOSIS — F10239 Alcohol dependence with withdrawal, unspecified: Secondary | ICD-10-CM

## 2016-08-15 DIAGNOSIS — F10280 Alcohol dependence with alcohol-induced anxiety disorder: Secondary | ICD-10-CM

## 2016-08-16 ENCOUNTER — Other Ambulatory Visit (HOSPITAL_COMMUNITY): Payer: Medicare Other | Admitting: Psychology

## 2016-08-16 DIAGNOSIS — F1020 Alcohol dependence, uncomplicated: Secondary | ICD-10-CM | POA: Diagnosis not present

## 2016-08-16 DIAGNOSIS — F4312 Post-traumatic stress disorder, chronic: Secondary | ICD-10-CM

## 2016-08-16 NOTE — Progress Notes (Signed)
    Daily Group Progress Note  Program: CD-IOP   08/16/2016 [name] [id number]  Diagnosis:  No diagnosis found.   Sobriety Date: 06/26/16  Group Time: 1-2:30 pm  Participation Level: Active  Behavioral Response: Appropriate and Sharing  Type of Therapy: Process Group  Interventions: Strength-based  Topic: Process: the first half of group was spent in process. Members were asked to share about the past weekend and what they had done to remain drug-free. Any activities or behaviors that strengthened their recovery and enhanced their sobriety were to be identified. In addition, any temptations or challenges to sobriety were also mentioned.  A new group member was present and he introduced himself to his new group. One drug test was collected. The program director met with a new group member and with another member regarding medications. At the break, the entire group prepared to go outside and view the eclipse, but it was cloudy and rainy and it was not possible to see the sun.   Group Time: 2:45- 4pm  Participation Level: Active  Behavioral Response: Sharing  Type of Therapy: Psycho-education Group  Interventions: Assertiveness Training  Topic: Psycho-Ed: Communication: the second half of group was spent in a psycho-ed on Communication was presented. Two role plays was offered by the co-therapists demonstrating passive and then assertive behaviors. Group members quickly pointed out the differences in the two role plays. A handout was provided and members took turns reading and learning about the differences between 'aggressive' and assertive' behaviors. There was good disclosure among members and a number of members identified themselves as 'passive' communicators. Group members were instructed to be attentive to their language with friends or loved ones in the days ahead and be prepared to report about them on Wednesday afternoon.   Summary: The patient was active and engaged  in the session today. She was very pleased with the retreat she had attended over the weekend and it had recharged her. It was a recovery-based retreat for women in recovery. It had been fun and was filled with lots of good activities and presentations. She emphasized how helpful it is to hear from other women in recovery and how they have dealt with the struggles and challenges that they have had to face.    UDS collected: No Results:  AA/NA attended?: YesMonday and Sunday  Sponsor?: Yes   Brandon Melnick, LCAS 08/16/2016 12:02 PM

## 2016-08-20 ENCOUNTER — Other Ambulatory Visit (HOSPITAL_COMMUNITY): Payer: Self-pay | Admitting: Medical

## 2016-08-20 ENCOUNTER — Other Ambulatory Visit (HOSPITAL_COMMUNITY): Payer: Medicare Other | Admitting: Psychology

## 2016-08-20 DIAGNOSIS — F1020 Alcohol dependence, uncomplicated: Secondary | ICD-10-CM

## 2016-08-20 DIAGNOSIS — F4312 Post-traumatic stress disorder, chronic: Secondary | ICD-10-CM

## 2016-08-20 NOTE — Progress Notes (Signed)
    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 Some members met with the program director to discuss medication management. A new member introduced themselves to the group. All members were actively engaged in the group discussion concerning recovery from mind-altering drugs and alcohol.      Group Time: 2:45-4  Participation Level: Active  Behavioral Response: Appropriate and Sharing  Type of Therapy: Psycho-education Group  Interventions: Motivational Interviewing and Solution Focused  Topic: Counselors met with patients for group psychoeducation regarding communication strategies. A successful graduate of the program with over six months of sobriety returned to speak to the group. The counselors asked the graduate questions regarding aftercare and then opened the floor to questions from the group. Most members were active and engaged in the discussion. A drug test was collected from one member.    Summary: Patient presented as active, engaged, and upbeat and well-groomed during sessions. She stated that she had attended 3 AA meetings and was currently working on the 5th step with her sponsor. Pt shared that she was able to join a local hiking club and is looking forward to getting more involved. Patient shared that she should be getting her car back from the mechanic and is looking forward to the flexibility this will allow her. She seems to be  doing well in her recovery and seems hopeful about the future. Youlanda Roys, COunselor   UDS collected: No Results: negative  AA/NA attended?: YesTuesday and Wednesday  Sponsor?: Yes   Brandon Melnick, LCAS 08/20/2016 9:39 AM

## 2016-08-21 ENCOUNTER — Encounter (HOSPITAL_COMMUNITY): Payer: Self-pay | Admitting: Psychology
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
## 2016-08-21 NOTE — Progress Notes (Signed)
    Daily Group Progress Note  Program: CD-IOP   08/21/2016 [name] [id number]  Diagnosis:  Alcohol use disorder, severe, dependence ([place])  Chronic post-traumatic stress disorder (PTSD)   Sobriety Date: 06/26/16  Group Time: 1-2:30  Participation Level: Active  Behavioral Response: Appropriate and Sharing  Type of Therapy: Process Group  Interventions: Motivational Interviewing and Solution Focused  Topic: Counselors met with patients for 1.5 hour group process session. Patients were active and engaged. Pt discussed their challenges related to recovery, sobriety, and building social support.      Group Time: 2:45-4  Participation Level: Active  Behavioral Response: Appropriate and Sharing  Type of Therapy: Psycho-education Group  Interventions: Motivational Interviewing and Solution Focused  Topic: Counselors met with patients for 1.5 hour group psychoeducation session. Patients were active and engaged. Counselors led topical discussion on "communication skills in recovery" utilizing resources and handouts from Seeking Safety, and Mindfulness Based Relapse Prevention Workbook. Patients were active and engaged in session and talked openly about their struggle to communicate with friends and family members.   Summary: Patient presented as active and engaged in sessions. Pt stated she was feeling "so drained" from her step work with her sponsor. Counselor reflected that she was doing lots of psychological work on herself. Patient continues to exhibit avoidant behaviors by overly involving herself with other individuals. She did report that she was able to say "no" to some requests from others at her church and in [place]. Patient reportedly used deep breathing to calm herself after an exhausting day. He meditates and reads AA literature daily. She is planning to attend 2 AA meetings soon. Patient is making good progress in tx and will continue for around 7 more sessions to  establish an aftercare plan and recommendations for ongoing therapy to address her PTSD, depression, and lack of self esteem. Youlanda Roys, Counselor   UDS collected: No Results: negative  AA/NA attended?: YesThursday  Sponsor?: Yes   Brandon Melnick, [place] 08/21/2016 4:06 PM

## 2016-08-22 ENCOUNTER — Encounter (HOSPITAL_COMMUNITY): Payer: Self-pay | Admitting: Psychology

## 2016-08-22 ENCOUNTER — Other Ambulatory Visit (HOSPITAL_COMMUNITY): Payer: Medicare Other | Admitting: Psychology

## 2016-08-22 DIAGNOSIS — F4312 Post-traumatic stress disorder, chronic: Secondary | ICD-10-CM

## 2016-08-22 DIAGNOSIS — F1020 Alcohol dependence, uncomplicated: Secondary | ICD-10-CM | POA: Diagnosis not present
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
## 2016-08-23 ENCOUNTER — Other Ambulatory Visit (HOSPITAL_COMMUNITY): Payer: Medicare Other | Admitting: Psychology

## 2016-08-23 DIAGNOSIS — F1020 Alcohol dependence, uncomplicated: Secondary | ICD-10-CM

## 2016-08-23 DIAGNOSIS — F4312 Post-traumatic stress disorder, chronic: Secondary | ICD-10-CM

## 2016-08-25 LAB — ETHYL GLUCURONIDE LC/MS/MS
ETG/ETS LC/MS/MS: POSITIVE — AB
ETHYL GLUCURONIDE LC/MS/MS: 13800 ng/mL
ETHYL GLUCURONIDE: POSITIVE — AB
ETHYL SULFATE LC/MS/MS: 4520 ng/mL
ETHYL SULFATE: POSITIVE — AB

## 2016-08-25 LAB — PRESCRIPTION ABUSE MONITORING 17P, URINE
6-Acetylmorphine, Urine: NEGATIVE ng/mL
Amphetamine Scrn, Ur: NEGATIVE ng/mL
BARBITURATE SCREEN URINE: NEGATIVE ng/mL
BENZODIAZEPINE SCREEN, URINE: NEGATIVE ng/mL
Buprenorphine, Urine: NEGATIVE ng/mL
CANNABINOIDS UR QL SCN: NEGATIVE ng/mL
Carisoprodol/Meprobamate, Ur: NEGATIVE ng/mL
Cocaine (Metab) Scrn, Ur: NEGATIVE ng/mL
Creatinine(Crt), U: 21 mg/dL (ref 20.0–300.0)
EDDP, Urine: NEGATIVE ng/mL
FENTANYL, URINE: NEGATIVE pg/mL
MDMA Screen, Urine: NEGATIVE ng/mL
MEPERIDINE SCREEN, URINE: NEGATIVE ng/mL
Methadone Screen, Urine: NEGATIVE ng/mL
Nitrite Urine, Quantitative: NEGATIVE ug/mL
OPIATE SCREEN URINE: NEGATIVE ng/mL
OXYCODONE+OXYMORPHONE UR QL SCN: NEGATIVE ng/mL
PH UR, DRUG SCRN: 6.1 (ref 4.5–8.9)
PROPOXYPHENE SCREEN URINE: NEGATIVE ng/mL
Phencyclidine Qn, Ur: NEGATIVE ng/mL
SPECIFIC GRAVITY: 1.011
TAPENTADOL, URINE: NEGATIVE ng/mL
TRAMADOL SCREEN, URINE: NEGATIVE ng/mL

## 2016-08-25 LAB — ETHYL GLUCURONIDE, URINE

## 2016-08-27 ENCOUNTER — Encounter (HOSPITAL_COMMUNITY): Payer: Self-pay | Admitting: Psychology

## 2016-08-27 NOTE — Progress Notes (Signed)
    Daily Group Progress Note  Program: CD-IOP   08/27/2016 Kathleen Robles [id number]  Diagnosis:  No diagnosis found.   Sobriety Date: 06/26/16  Group Time: 1-2:30 pm  Participation Level: Active  Behavioral Response: Sharing  Type of Therapy: Process Group  Interventions: Solution Focused  Topic: Process: the first half of group was spent in process. Members disclosed the ways they have supported their sobriety since we last met. These could include attending meetings, step work, meetings with sponsor or others in recovery, but also healthy activities, like the gym or walking, that they may have stopped doing in the past. A new group member was present and she introduced herself. She was shy and reserved and admitted she was very nervous. Random drug tests were collected. The program director met with group members during the session, including the new member.  Group Time: 2:30-4 pm  Participation Level: Active  Behavioral Response: Appropriate and Sharing  Type of Therapy: Psycho-education Group  Interventions: Strength-based  Topic: Psycho-Ed: The Pros and Cons of Addiction versus Sobriety.  The second half of group was spent in a psycho-e. Four squares were drawn on the board and members were asked to identify the pros and cons of using versus being clean and sober. The discussion was lively and animated. The new member was much more active and easily identified at least 4 different advantages to sobriety. There were many advantages to sobriety versus active addiction at the end of this session.   Summary: The patient reported a busy few days. "I'd doing well", she explained. The patient had attended 3 AA meetings and met with her sponsor. She expressed frustration over the difficulty and challenges of Step 5. "I stirs up so much of the past", she shared. It was pointed out, though, that getting that 'stuff from the past' out of her is essential in recovery. The patient nodded  and admitted she recognizes the importance of this step and doing a thorough 'cleaning', but it is still very hard. The patient provided good support and encouragement to the member who admitted using yestereday. She encouraged her, 'don't beat yourself up'. During the psycho-ed, the patient was active in the session on pros and cons. The patient identified one of the pros of using as 'it relieves pain'. Other members concurred with this observation. But she also noted that one of the biggest cons of her drinking was the 'loss of trust'. The patient laughed and observed that sobriety has allowed her to 'feel again', but also noted that at times, this was painful. The counselor quickly pointed out to all members that life has its painful moments and part of being healthy is experiencing and going through them. The patient continues to make progress in her recovery and since her trazadone prescription was increased, she is sleeping better. The patient did begin Campral last week, though, due to stronger thoughts of using. She made some good comments and responded well to this intervention.    UDS collected: No Results:   AA/NA attended?: Yes, 3 meetings - Monday, Tuesday and Wednesday  Sponsor?: Yes   Kathleen Robles, LCAS 08/27/2016 10:01 AM

## 2016-08-28 NOTE — Progress Notes (Signed)
    Daily Group Progress Note  Program: CD-IOP   08/28/2016 [name] [id number]  Diagnosis:  Alcohol use disorder, severe, dependence ([place])  Chronic post-traumatic stress disorder (PTSD)   Sobriety Date: 06/26/16  Group Time: 1-2:30  Participation Level: Active  Behavioral Response: Appropriate and Sharing  Type of Therapy: Process Group  Interventions: CBT, Motivational Interviewing and Solution Focused  Topic: Counselors met with patients for group process session. Members shared about their challenges and successes related to their recovery. Members also discussed 12 Step meeting attendance and weekend plans for the upcoming Labor Day holiday. All patients were active and engaged. One new member was present for group.   Group Time: 2:30-4  Participation Level: Active  Behavioral Response: Appropriate and Sharing  Type of Therapy: Psycho-education Group  Interventions: Motivational Interviewing and Solution Focused  Topic: Counselors met with patients for group psychoeducation session on the topic of "family of origin ([place]) and addiction". Patients did a mindfulness exercise remembering a past family food/ritual in order to stimulate conversation about [place]. Patients completed an "I am from" poem which detailed their childhood and family habits. Patients shared openly about their experience in childhood and how it impacted their addiction and relationships in their adulthood.    Summary: Patient presented as active and engaged in group. She shared openly and meaningfully during psychoeducation session discussing her [place] and guilt about staying with her husband and not providing a better childhood for her children. Patient rt that her childhood was great but she felt sad that she was unable to provide that for her own children. Patient rt meeting with her AA friends and sponsor. Pt stated that she used to have "screaming nightmares" about regrets, her dead husband, and her  kids though she did not disclose details. Counselor encouraged pt to explore her feelings deeper with the group members present so as to reduce the guilt and shame. Pt agreed and said that she felt better to share with others. Patient shared about her upcoming plans for the holiday which involve spending time with sober friends and attending AA. Youlanda Roys, LPC-A   UDS collected: No Results:  AA/NA attended?: YesWednesday  Sponsor?: Yes   Brandon Melnick, LCAS 08/28/2016 11:20 AM

## 2016-08-29 ENCOUNTER — Other Ambulatory Visit (HOSPITAL_COMMUNITY): Payer: Medicare Other | Attending: Psychiatry | Admitting: Psychology

## 2016-08-29 ENCOUNTER — Encounter (HOSPITAL_COMMUNITY): Payer: Self-pay | Admitting: Psychology

## 2016-08-29 DIAGNOSIS — Z91410 Personal history of adult physical and sexual abuse: Secondary | ICD-10-CM

## 2016-08-29 DIAGNOSIS — F1020 Alcohol dependence, uncomplicated: Secondary | ICD-10-CM

## 2016-08-29 DIAGNOSIS — F4310 Post-traumatic stress disorder, unspecified: Secondary | ICD-10-CM | POA: Diagnosis not present

## 2016-08-29 DIAGNOSIS — F4312 Post-traumatic stress disorder, chronic: Secondary | ICD-10-CM

## 2016-08-30 ENCOUNTER — Other Ambulatory Visit (HOSPITAL_COMMUNITY): Payer: Medicare Other | Admitting: Psychology

## 2016-08-30 DIAGNOSIS — F10239 Alcohol dependence with withdrawal, unspecified: Secondary | ICD-10-CM

## 2016-08-30 DIAGNOSIS — F4312 Post-traumatic stress disorder, chronic: Secondary | ICD-10-CM

## 2016-08-30 DIAGNOSIS — F10280 Alcohol dependence with alcohol-induced anxiety disorder: Secondary | ICD-10-CM

## 2016-08-30 DIAGNOSIS — F1020 Alcohol dependence, uncomplicated: Secondary | ICD-10-CM | POA: Diagnosis not present

## 2016-08-30 DIAGNOSIS — Z91410 Personal history of adult physical and sexual abuse: Secondary | ICD-10-CM

## 2016-08-30 NOTE — Progress Notes (Signed)
Kathleen Robles is a 70 y.o. female patient. CD-IOP: Positive Drug Test. The results of a random drug test collected back on the 21st was received and it was positive for alcohol. The patient had not disclosed any use and she was scheduled to graduate from the program successfully. It was determined during the Treatment Team meeting that she would be asked to remain in the program for approximately 2 more weeks. She has no obligations to remain in the CD-IOP, but if she hopes to graduate successfully, she will have to remain and provide negative drug tests prior to discharge. She will be given the results before group starts today. This counselor will meet with her and give her the option of checking-in with a new sobriety date. Will continue to follow closely in the days ahead.         Charmian MuffAnn Daniyal Tabor, LCAS
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
## 2016-09-03 ENCOUNTER — Other Ambulatory Visit (HOSPITAL_COMMUNITY): Payer: Medicare Other | Admitting: Psychology

## 2016-09-03 ENCOUNTER — Other Ambulatory Visit (HOSPITAL_COMMUNITY): Payer: Self-pay | Admitting: Medical

## 2016-09-03 DIAGNOSIS — F1020 Alcohol dependence, uncomplicated: Secondary | ICD-10-CM

## 2016-09-03 DIAGNOSIS — Z91410 Personal history of adult physical and sexual abuse: Secondary | ICD-10-CM

## 2016-09-03 DIAGNOSIS — F4312 Post-traumatic stress disorder, chronic: Secondary | ICD-10-CM

## 2016-09-03 DIAGNOSIS — S060X0D Concussion without loss of consciousness, subsequent encounter: Secondary | ICD-10-CM

## 2016-09-05 ENCOUNTER — Other Ambulatory Visit (HOSPITAL_COMMUNITY): Payer: Medicare Other | Admitting: Psychology

## 2016-09-05 DIAGNOSIS — F10239 Alcohol dependence with withdrawal, unspecified: Secondary | ICD-10-CM

## 2016-09-05 DIAGNOSIS — F4312 Post-traumatic stress disorder, chronic: Secondary | ICD-10-CM

## 2016-09-05 DIAGNOSIS — F10280 Alcohol dependence with alcohol-induced anxiety disorder: Secondary | ICD-10-CM

## 2016-09-05 DIAGNOSIS — F1020 Alcohol dependence, uncomplicated: Secondary | ICD-10-CM | POA: Diagnosis not present

## 2016-09-05 LAB — PRESCRIPTION ABUSE MONITORING 17P, URINE
6-ACETYLMORPHINE, URINE: NEGATIVE ng/mL
AMPHETAMINE SCREEN URINE: NEGATIVE ng/mL
BARBITURATE SCREEN URINE: NEGATIVE ng/mL
BENZODIAZEPINE SCREEN, URINE: NEGATIVE ng/mL
BUPRENORPHINE, URINE: NEGATIVE ng/mL
CANNABINOIDS UR QL SCN: NEGATIVE ng/mL
CARISOPRODOL/MEPROBAMATE, UR: NEGATIVE ng/mL
COCAINE(METAB.)SCREEN, URINE: NEGATIVE ng/mL
CREATININE(CRT), U: 20.9 mg/dL (ref 20.0–300.0)
EDDP, Urine: NEGATIVE ng/mL
Fentanyl, Urine: NEGATIVE pg/mL
MDMA SCREEN, URINE: NEGATIVE ng/mL
METHADONE SCREEN, URINE: NEGATIVE ng/mL
Meperidine Screen, Urine: NEGATIVE ng/mL
Nitrite Urine, Quantitative: NEGATIVE ug/mL
OPIATE SCREEN URINE: NEGATIVE ng/mL
OXYCODONE+OXYMORPHONE UR QL SCN: NEGATIVE ng/mL
PHENCYCLIDINE QUANTITATIVE URINE: NEGATIVE ng/mL
Ph of Urine: 5.8 (ref 4.5–8.9)
Propoxyphene Scrn, Ur: NEGATIVE ng/mL
SPECIFIC GRAVITY: 1.008
TAPENTADOL, URINE: NEGATIVE ng/mL
Tramadol Screen, Urine: NEGATIVE ng/mL

## 2016-09-05 LAB — ETHYL GLUCURONIDE, URINE: ETHYL GLUCURONIDE SCREEN, UR: NEGATIVE ng/mL

## 2016-09-06 ENCOUNTER — Encounter (HOSPITAL_COMMUNITY): Payer: Self-pay | Admitting: Psychology

## 2016-09-06 ENCOUNTER — Other Ambulatory Visit (HOSPITAL_COMMUNITY): Payer: Medicare Other | Admitting: Psychology

## 2016-09-06 DIAGNOSIS — F1020 Alcohol dependence, uncomplicated: Secondary | ICD-10-CM | POA: Diagnosis not present

## 2016-09-06 DIAGNOSIS — Z91410 Personal history of adult physical and sexual abuse: Secondary | ICD-10-CM

## 2016-09-06 DIAGNOSIS — F4312 Post-traumatic stress disorder, chronic: Secondary | ICD-10-CM

## 2016-09-06 NOTE — Progress Notes (Signed)
Daily Group Progress Note  Program: CD-IOP   09/06/2016 Kathleen Robles [id number]  Diagnosis:  No diagnosis found.   Sobriety Date: 08/29/16  Group Time: 1-2:30 pm  Participation Level: Active  Behavioral Response: Appropriate and Sharing  Type of Therapy: Process Group  Interventions: Strength-based  Topic: Process: the first half of group was spent in process. Members shared about the past holiday weekend and the things that they had done to support their recovery. Two new group members were present and introduced themselves during the session today. The drug tests collected from last week were returned. A member was scheduled to graduate today, but her drug test came back positive for alcohol. She processed the return to use (twice) with the group. Drug tests were collected from all group members today. The program director met with 2 group members.   Group Time: 2:30-4 pm  Participation Level: Active  Behavioral Response: Sharing  Type of Therapy: Psycho-education Group  Interventions: Supportive  Topic: Psycho-Ed: Jenga. The second half of group was spent in a psycho-ed utilizing the game [place]. Different feelings, from the Feeling Wheel, were written on each piece and members picked out a piece from the structure and then shared the word with fellow group members. Each member used the feeling word they had chosen in a sentence describing that feeling in their childhood. The session was emotional, poignant and revealing. Members responded well to this intervention and the new group members shared openly about their lives in addiction.   Summary: The patient arrived this afternoon very upbeat and excited about her trip up to [place] to visit friends over the past holiday weekend. This counselor asked to speak with her briefly outside the group room and showed her drug test result from last week. It was positive for alcohol. She admitted she had drunk that Sunday before the test  was collected on that Monday. The patient reported she had been so lonely and had gone to the grocery store and bought the wine. She hadn't enjoyed it and had felt very guilty, but she had not spoken of this to anyone and it had been 10 days since the return to use. The patient expressed remorse and reported, "I failed again". I encouraged her to share about her use during the check-in. The patient proceeded to check-in with a sobriety date of today. Most of the group members appeared stunned and certainly surprised to hear this. The group processed the slip with the patient. She admitted she had never phoned anyone when she was considering drinking, but only phoned when times were good. Overall, the group was very supportive of this member and encouraged her not to beat herself up, but to pick up a chip and get back on track. The patient expressed remorse and shame and reminded the group that she had never drank socially or in public, but that it had always been alone. It mimicked her past slips. The patient was encouraged not to consider a return to use as a failure, but as an opportunity to learn about herself and devise strategies to address those vulnerabilities that allowed the relapse in the first place. The patient received kind supportive encouragement and gradually seemed able to life herself back to full engagement in the session. She also admitted she had drunk last night so this counselor opted not to collect a drug test today due to her self-report. The patient's word in the [place] exercise was 'insecure'. She was not able to apply it  to her childhood, but admitted, "I felt very insecure in my marriage". The session seemed to exhaust this patient, but there was much to talk about. She was supposed to have graduated today, but she had opted to stay in the program for a couple more weeks and address the events leading up to her drinking. She responded well to this intervention.     UDS collected: No  Results: patient self-reported drinking last night  AA/NA attended?: YesWednesday  Sponsor?: Yes   Brandon Melnick, [place] 09/06/2016 9:55 AM

## 2016-09-06 NOTE — Progress Notes (Signed)
    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 Discussion included immediate experiencing, recovery-related topics, and challenges to members' sobriety. A drug test was collected from one group member who was absent last session. Program director Darlyne Russian, [place], saw 2 patients. One patient graduated and discharged successfully.   Group Time: 2:30-4  Participation Level: Active  Behavioral Response: Appropriate and Sharing  Type of Therapy: Psycho-education Group  Interventions: CBT, Solution Focused and Supportive  Topic: Patients met with counselors for 1.5 hour group psychoeducation session. A [facility] chaplain Bruce Messenger joined session and led relaxation and breathing exercise for 15 mins. Discussion included patients' core beliefs and some members were able to work extensively on challenging their negative core beliefs. Counselors utilized Therapist, sports from [employer] Meaning".    Summary: Patient was active and engaged in group despite being a little "on edge". She reported she was worrying about a recent dizziness episode she had while exercising with her PT. She stated that during an exercise the room started "spinning" and she was instructed to relax and had her BP checked which was elevated at the time of incident. She stated she is worried because her mother had vertigo. Pt also admitted she was "frustrated since things  were going well before this". She continues to report a steady schedule of exercise, meditation, AA reading, and AA meeting attendance. She is stable on her medication and has no significant cravings for alcohol. Patient was pleased to report that she "did not drink over dizziness spell" and is glad that her anxiety feels "manageable" and she knows the anxiety "will pass soon". She seems to be making good progress with opening up to group members about her vulnerabilities and fears. Youlanda Roys, LPCA   UDS collected: No Results: negative  AA/NA attended?: YesTuesday  Sponsor?: Yes   Brandon Melnick, LCAS 09/06/2016 11:24 AM

## 2016-09-07 ENCOUNTER — Encounter (HOSPITAL_COMMUNITY): Payer: Self-pay | Admitting: Psychology

## 2016-09-07 NOTE — Progress Notes (Signed)
    Daily Group Progress Note  Program: CD-IOP   09/07/2016 [name] [id number]  Diagnosis:  No diagnosis found.   Sobriety Date: 08/29/16  Group Time: 1-2:30 pm  Participation Level: Active  Behavioral Response: Sharing  Type of Therapy: Process Group  Interventions: Supportive  Topic: Counselors met with patients to process recovery-related activities they had engaged in since the prior meeting. Several of the newer group members met with the program director to discuss medication management. One new patient joined the group. He shared about himself and his addiction. All members were active and engaged in the discussion concerning recovery from mind-altering drugs and alcohol.   Group Time: 2:30 -4 pm  Participation Level: Active  Behavioral Response: Sharing  Type of Therapy: Psycho-education Group  Interventions: Solution Focused  Topic: Core Beliefs: Counselors facilitated a discussion regarding negative core beliefs that group members had internalized. Often these negative core beliefs fuel or trigger a return to use or relapse. The counselors helped members identify these beliefs and began discussing how to refute them. Most members were active and engaged. Drug tests were collected today.    Summary: Patient engaged in group, but was more reserved than usual. She was dressed casually and complained of being cold during session. Pt reported attending four AA meetings over the weekend, and was able to engage in quality time with her son and grandchildren. She was also able to volunteer in her church community, oversee a work delivery, and work in her garden. These activities seemed to give the patient a sense of satisfaction. During the psycho-ed portion the patient was easily able to identify several negative core beliefs and struggled to refute them. She responded well to interventions by counselors and other group members, but admitted that changing her thinking  and challenging these long-held core beliefs will be a life-long process. A drug test was collected from the patient.     UDS collected: Yes Results: negative  AA/NA attended?: YesMonday, Friday, Saturday and Sunday  Sponsor?: Yes   Brandon Melnick, LCAS 09/07/2016 9:12 AM

## 2016-09-10 ENCOUNTER — Other Ambulatory Visit (HOSPITAL_COMMUNITY): Payer: Medicare Other | Admitting: Psychology

## 2016-09-10 ENCOUNTER — Encounter (HOSPITAL_COMMUNITY): Payer: Self-pay | Admitting: Medical

## 2016-09-10 ENCOUNTER — Encounter (HOSPITAL_COMMUNITY): Payer: Self-pay | Admitting: Psychology

## 2016-09-10 DIAGNOSIS — F1020 Alcohol dependence, uncomplicated: Secondary | ICD-10-CM | POA: Diagnosis not present

## 2016-09-10 DIAGNOSIS — Z91411 Personal history of adult psychological abuse: Secondary | ICD-10-CM

## 2016-09-10 DIAGNOSIS — IMO0002 Reserved for concepts with insufficient information to code with codable children: Secondary | ICD-10-CM

## 2016-09-10 DIAGNOSIS — F4312 Post-traumatic stress disorder, chronic: Secondary | ICD-10-CM

## 2016-09-10 DIAGNOSIS — Z6372 Alcoholism and drug addiction in family: Secondary | ICD-10-CM

## 2016-09-10 DIAGNOSIS — H8112 Benign paroxysmal vertigo, left ear: Secondary | ICD-10-CM

## 2016-09-10 NOTE — Progress Notes (Signed)
Patient ID: Kathleen MornNancy Tomko, female   DOB: 23-Sep-1946, 70 y.o.   MRN: [id number] S -PT REPORTS HER PCP WANTED  HER TO ASK ABOUT CAMPRAL AS CAUSE OF HER c./o VERTIGO OVER PAST WEEK-PT STARTED CAMPRAL SOME WEEKS  AGO WITHOUT  DIFFICULTY PT REPORTS SHE HAD FLUID ON HER EARS AND THAT WHEN SHE TURNS TO LIE ON LT SIDE SHE EXPERIENCES ACUTE /SEVERE VERTIGO. SCHEDULED FOR D/C FROM CDIOP THURSDAY  O- ALERT ORIENTED APPROPRIATE AND COMPREHENSIBLE.GAIT AND STATION NORMAL.NOT CHALLENGED TO REPRODUCE VERTIGO HERE  A-BPV  P-CONTINUE CAMPRAL AND FU WITH PCP.     [facility]

## 2016-09-10 NOTE — Progress Notes (Signed)
Daily Group Progress Note  Program: CD-IOP   09/10/2016 Kathleen Robles [id number]  Diagnosis:  No diagnosis found.   Sobriety Date: 08/29/16  Group Time: 1-3 pm  Participation Level: Active  Behavioral Response: Sharing  Type of Therapy: Process Group  Interventions: Biofeedback  Topic: Process: the first half of group was spent in process. Members shared about the things they had done to support their sobriety since we met yesterday. A lively discussion about the visitor we had yesterday in group. Members expressed strong feelings about the chaplain and whether they had benefitted from the interaction with him. Despite the various interpretations of any value or benefit that he might have provided the group, this session went over the usual time because members felt so adamant about yesterday's session. This counselor pointed out that the primary intention of the chaplain is to awaken people and stir up some feelings.  Apparently, mission was accomplished.   Group Time: 3-4 pm  Participation Level: Active  Behavioral Response: Sharing  Type of Therapy: Psycho-education Group  Interventions: Strength-based  Topic: Grieving Your Addiction: What does healthy grieving look like? A psycho-ed about grief was offered. Members were asked to identify what they have done in the past to grieve a loss? Members explained what they have done in the past as well as things they failed to do and have struggled with as a result. Before the session ended, members shared what they intended to do over the weekend to stay clean and sober.   Summary: The patient reported she had not attended any meetings since we last met, but had spoken to her sponsor daily and read her literature as she does every day. The patient expressed concern about some dizziness she had recently experienced and was worried about it. She admittedly tends to catastrophize things and she wondered what was causing this problem.  The group reminded her that she would be best served seeing her PCP and being referred to a specialist. The patient noted she has an appointment tomorrow afternoon. The patient reported that she is focusing on 'personal assets' in her writing on Step 4. This was applauded and she was asked to really pay attention to the assets she recognizes as her own. This patient laughed and the counselor emphasized this point and agreed that she is very hard on herself. Her lack of self-love is the foundational problem that this 69 yo woman has. Later in the session, her declaration of just wanting 'somebody to care for me' was very poignant. During the psycho-ed on grieving one's addiction, the patient admitted that she drank because her need to be loved was so great and so unmet that the alcohol numbed her to this pain. The patient explained that 'to love me, means putting myself ahead of others'. The patient was very revealing in group today and the pain of her life and lack of tenderness and being nurtured was palpable. She responded well to this intervention.    UDS collected: No Results:  AA/NA attended?: No  Sponsor?: Yes    , LCAS 09/10/2016 9:29 AM 
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
## 2016-09-12 ENCOUNTER — Encounter (HOSPITAL_COMMUNITY): Payer: Self-pay | Admitting: Medical

## 2016-09-12 ENCOUNTER — Other Ambulatory Visit (HOSPITAL_COMMUNITY): Payer: Medicare Other | Admitting: Psychology

## 2016-09-12 DIAGNOSIS — Z91411 Personal history of adult psychological abuse: Secondary | ICD-10-CM

## 2016-09-12 DIAGNOSIS — IMO0002 Reserved for concepts with insufficient information to code with codable children: Secondary | ICD-10-CM

## 2016-09-12 DIAGNOSIS — F10982 Alcohol use, unspecified with alcohol-induced sleep disorder: Secondary | ICD-10-CM

## 2016-09-12 DIAGNOSIS — F10239 Alcohol dependence with withdrawal, unspecified: Secondary | ICD-10-CM

## 2016-09-12 DIAGNOSIS — F10280 Alcohol dependence with alcohol-induced anxiety disorder: Secondary | ICD-10-CM

## 2016-09-12 DIAGNOSIS — F1020 Alcohol dependence, uncomplicated: Secondary | ICD-10-CM | POA: Diagnosis not present

## 2016-09-12 DIAGNOSIS — Z6372 Alcoholism and drug addiction in family: Secondary | ICD-10-CM

## 2016-09-12 DIAGNOSIS — F4312 Post-traumatic stress disorder, chronic: Secondary | ICD-10-CM

## 2016-09-12 DIAGNOSIS — Z8659 Personal history of other mental and behavioral disorders: Secondary | ICD-10-CM

## 2016-09-12 DIAGNOSIS — Z91410 Personal history of adult physical and sexual abuse: Secondary | ICD-10-CM
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
## 2016-09-13 ENCOUNTER — Other Ambulatory Visit (HOSPITAL_COMMUNITY): Payer: Medicare Other | Admitting: Psychology

## 2016-09-13 ENCOUNTER — Ambulatory Visit (HOSPITAL_COMMUNITY): Payer: Self-pay | Admitting: Psychiatry

## 2016-09-13 DIAGNOSIS — Z91411 Personal history of adult psychological abuse: Secondary | ICD-10-CM

## 2016-09-13 DIAGNOSIS — F4312 Post-traumatic stress disorder, chronic: Secondary | ICD-10-CM

## 2016-09-13 DIAGNOSIS — F1020 Alcohol dependence, uncomplicated: Secondary | ICD-10-CM

## 2016-09-17 ENCOUNTER — Other Ambulatory Visit (HOSPITAL_COMMUNITY): Payer: Medicare Other

## 2016-09-17 ENCOUNTER — Encounter (HOSPITAL_COMMUNITY): Payer: Self-pay | Admitting: Psychology

## 2016-09-17 NOTE — Progress Notes (Signed)
    Daily Group Progress Note  Program: CD-IOP   09/17/2016 [name] [id number]  Diagnosis:  No diagnosis found.   Sobriety Date: 08/29/16  Group Time: 1-2:30 pm  Participation Level: Active  Behavioral Response: Appropriate  Type of Therapy: Process Group  Interventions: Biofeedback  Topic: Process: the first part of group was spent in process. Members shared about what they had done to support their sobriety since we last met. They also discussed any challenges or temptations they had also experienced. Two guests were present to observe group and they introduce themselves. A new group member was present and she was asked to disclose what had brought her here. The session ended with a 10-minute guided meditation.   Group Time: 2:30-4 pm  Participation Level: Active  Behavioral Response: Sharing  Type of Therapy: Psycho-education Group  Interventions: Strength-based  Topic: Psycho-Ed/Graduation: the second half of group was spent in a psycho-ed on Self-Care. A slide show was presented with emphasis on diet, sleep and exercise. Education, based on the most recent research, was provided on each of these topics and members discussed what their lifestyles have been in each instance. The psycho-ed ended early to allow for a graduation ceremony. Brownies were provided and the medallion was passed around with each member offering words of hope and gratitude to the graduating member.   Summary: The patient reported she had attended an [place] meeting at 8 pm last night. But prior to the meeting, she had met with her sponsor after group. She had spent over 2 hours discussing the 5th step with her sponsor. Today in group, the patient felt relieved to have completed the 5th step and now to be onto the 6th step. The patient described the 5th step as both "humbling and an eye-opener". The patient was excited and explained, "I have never gotten to Step 6". In the psycho-ed, the patient shared  about her knowledge of diet, nutrients and calories, but it had not gone unnoticed that she had admitted to being anorexic earlier in her life. She is sleeping much better now that her Trazadone has been increased. Regarding exercise, the patient shared about her walking and how she intends to be more active in the [place] hiking club now that she will be free on Wednesday afternoons. She remains compulsive about exercise and her weight, but for now, the patient is not drinking. This patient was graduating today and her fellow group members shared kind and thoughtful words with her on this last day of group.  She received the kind words with surprise, but gratitude and wished her fellow group members good luck and warm thoughts of strength and perseverance. The patient responded well to this final intervention and leaves the program with all the tools she will need to remain alcohol-free going forward.    UDS collected: No Results:   AA/NA attended?: YesWednesday  Sponsor?: Yes   Brandon Melnick, LCAS 09/17/2016 5:10 PM
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
## 2016-09-19 ENCOUNTER — Other Ambulatory Visit (HOSPITAL_COMMUNITY): Payer: Medicare Other | Admitting: Psychology

## 2016-09-20 ENCOUNTER — Other Ambulatory Visit (HOSPITAL_COMMUNITY): Payer: Medicare Other

## 2016-09-20 DIAGNOSIS — Z87898 Personal history of other specified conditions: Secondary | ICD-10-CM | POA: Diagnosis not present

## 2016-09-20 DIAGNOSIS — F419 Anxiety disorder, unspecified: Secondary | ICD-10-CM | POA: Diagnosis not present

## 2016-09-20 DIAGNOSIS — R42 Dizziness and giddiness: Secondary | ICD-10-CM | POA: Diagnosis not present

## 2016-09-20 DIAGNOSIS — I10 Essential (primary) hypertension: Secondary | ICD-10-CM | POA: Diagnosis not present

## 2016-09-22 ENCOUNTER — Other Ambulatory Visit: Payer: Self-pay | Admitting: Family Medicine

## 2016-09-22 DIAGNOSIS — R42 Dizziness and giddiness: Secondary | ICD-10-CM

## 2016-09-24 ENCOUNTER — Other Ambulatory Visit (HOSPITAL_COMMUNITY): Payer: Medicare Other

## 2016-09-30 ENCOUNTER — Other Ambulatory Visit (HOSPITAL_COMMUNITY): Payer: Self-pay | Admitting: Medical

## 2016-10-01 ENCOUNTER — Ambulatory Visit
Admission: RE | Admit: 2016-10-01 | Discharge: 2016-10-01 | Disposition: A | Discharge status: Home / Self Care | Payer: Medicare Other | Source: Ambulatory Visit | Attending: Family Medicine | Admitting: Family Medicine

## 2016-10-01 DIAGNOSIS — R42 Dizziness and giddiness: Secondary | ICD-10-CM

## 2016-10-01 MED ORDER — GADOBENATE DIMEGLUMINE 529 MG/ML IV SOLN
10.0000 mL | Freq: Once | INTRAVENOUS | Status: AC | PRN
  Administered 2016-10-01: 10 mL via INTRAVENOUS

## 2016-10-05 DIAGNOSIS — H8112 Benign paroxysmal vertigo, left ear: Secondary | ICD-10-CM | POA: Diagnosis not present

## 2016-10-12 DIAGNOSIS — H00022 Hordeolum internum right lower eyelid: Secondary | ICD-10-CM | POA: Diagnosis not present

## 2016-10-12 DIAGNOSIS — H25813 Combined forms of age-related cataract, bilateral: Secondary | ICD-10-CM | POA: Diagnosis not present

## 2016-10-12 DIAGNOSIS — H5203 Hypermetropia, bilateral: Secondary | ICD-10-CM | POA: Diagnosis not present

## 2016-10-12 DIAGNOSIS — H52223 Regular astigmatism, bilateral: Secondary | ICD-10-CM | POA: Diagnosis not present

## 2016-11-08 DIAGNOSIS — H25811 Combined forms of age-related cataract, right eye: Secondary | ICD-10-CM | POA: Diagnosis not present

## 2016-11-30 DIAGNOSIS — H1789 Other corneal scars and opacities: Secondary | ICD-10-CM | POA: Diagnosis not present

## 2016-11-30 DIAGNOSIS — H25813 Combined forms of age-related cataract, bilateral: Secondary | ICD-10-CM | POA: Diagnosis not present

## 2016-12-25 ENCOUNTER — Telehealth (HOSPITAL_COMMUNITY): Payer: Self-pay

## 2016-12-25 NOTE — Telephone Encounter (Signed)
Fax received from patients pharmacy for a refill on Sertraline, patient finished CD-IOP in September and I see no more appointments - please review and advise, thank you

## 2016-12-26 NOTE — Telephone Encounter (Signed)
Pt to FU with PCP [name] MD for medications

## 2017-12-20 DIAGNOSIS — Z20828 Contact with and (suspected) exposure to other viral communicable diseases: Secondary | ICD-10-CM | POA: Diagnosis not present

## 2018-01-06 DIAGNOSIS — Z8679 Personal history of other diseases of the circulatory system: Secondary | ICD-10-CM | POA: Diagnosis not present

## 2018-01-06 DIAGNOSIS — E559 Vitamin D deficiency, unspecified: Secondary | ICD-10-CM | POA: Diagnosis not present

## 2018-01-06 DIAGNOSIS — G4700 Insomnia, unspecified: Secondary | ICD-10-CM | POA: Diagnosis not present

## 2018-01-06 DIAGNOSIS — F419 Anxiety disorder, unspecified: Secondary | ICD-10-CM | POA: Diagnosis not present

## 2018-01-06 DIAGNOSIS — Z87898 Personal history of other specified conditions: Secondary | ICD-10-CM | POA: Diagnosis not present

## 2018-01-06 DIAGNOSIS — I10 Essential (primary) hypertension: Secondary | ICD-10-CM | POA: Diagnosis not present

## 2018-01-06 DIAGNOSIS — E7800 Pure hypercholesterolemia, unspecified: Secondary | ICD-10-CM | POA: Diagnosis not present

## 2018-01-30 DIAGNOSIS — J069 Acute upper respiratory infection, unspecified: Secondary | ICD-10-CM | POA: Diagnosis not present

## 2018-05-12 ENCOUNTER — Encounter (HOSPITAL_COMMUNITY): Payer: Self-pay | Admitting: Psychology

## 2018-07-07 DIAGNOSIS — E7800 Pure hypercholesterolemia, unspecified: Secondary | ICD-10-CM | POA: Diagnosis not present

## 2018-07-07 DIAGNOSIS — G4700 Insomnia, unspecified: Secondary | ICD-10-CM | POA: Diagnosis not present

## 2018-07-07 DIAGNOSIS — E559 Vitamin D deficiency, unspecified: Secondary | ICD-10-CM | POA: Diagnosis not present

## 2018-07-07 DIAGNOSIS — F419 Anxiety disorder, unspecified: Secondary | ICD-10-CM | POA: Diagnosis not present

## 2018-09-05 DIAGNOSIS — H52223 Regular astigmatism, bilateral: Secondary | ICD-10-CM | POA: Diagnosis not present

## 2018-09-05 DIAGNOSIS — H5203 Hypermetropia, bilateral: Secondary | ICD-10-CM | POA: Diagnosis not present

## 2018-09-05 DIAGNOSIS — H25813 Combined forms of age-related cataract, bilateral: Secondary | ICD-10-CM | POA: Diagnosis not present

## 2018-09-05 DIAGNOSIS — H1789 Other corneal scars and opacities: Secondary | ICD-10-CM | POA: Diagnosis not present

## 2018-12-05 DIAGNOSIS — E559 Vitamin D deficiency, unspecified: Secondary | ICD-10-CM | POA: Diagnosis not present

## 2018-12-05 DIAGNOSIS — Z0000 Encounter for general adult medical examination without abnormal findings: Secondary | ICD-10-CM | POA: Diagnosis not present

## 2018-12-05 DIAGNOSIS — Z23 Encounter for immunization: Secondary | ICD-10-CM | POA: Diagnosis not present

## 2018-12-05 DIAGNOSIS — I10 Essential (primary) hypertension: Secondary | ICD-10-CM | POA: Diagnosis not present

## 2018-12-05 DIAGNOSIS — F419 Anxiety disorder, unspecified: Secondary | ICD-10-CM | POA: Diagnosis not present

## 2018-12-05 DIAGNOSIS — Z1159 Encounter for screening for other viral diseases: Secondary | ICD-10-CM | POA: Diagnosis not present

## 2018-12-05 DIAGNOSIS — Z87898 Personal history of other specified conditions: Secondary | ICD-10-CM | POA: Diagnosis not present

## 2018-12-05 DIAGNOSIS — E7800 Pure hypercholesterolemia, unspecified: Secondary | ICD-10-CM | POA: Diagnosis not present

## 2018-12-11 ENCOUNTER — Other Ambulatory Visit: Payer: Self-pay | Admitting: Family Medicine

## 2018-12-11 DIAGNOSIS — Z1231 Encounter for screening mammogram for malignant neoplasm of breast: Secondary | ICD-10-CM

## 2018-12-12 DIAGNOSIS — E871 Hypo-osmolality and hyponatremia: Secondary | ICD-10-CM | POA: Diagnosis not present

## 2018-12-16 DIAGNOSIS — E871 Hypo-osmolality and hyponatremia: Secondary | ICD-10-CM | POA: Diagnosis not present

## 2018-12-29 DIAGNOSIS — F419 Anxiety disorder, unspecified: Secondary | ICD-10-CM | POA: Diagnosis not present

## 2018-12-29 DIAGNOSIS — R42 Dizziness and giddiness: Secondary | ICD-10-CM | POA: Diagnosis not present

## 2019-01-21 ENCOUNTER — Ambulatory Visit
Admission: RE | Admit: 2019-01-21 | Discharge: 2019-01-21 | Disposition: A | Discharge status: Home / Self Care | Payer: Medicare Other | Source: Ambulatory Visit | Attending: Family Medicine | Admitting: Family Medicine

## 2019-01-21 DIAGNOSIS — Z1231 Encounter for screening mammogram for malignant neoplasm of breast: Secondary | ICD-10-CM | POA: Diagnosis not present

## 2019-04-29 ENCOUNTER — Encounter (HOSPITAL_COMMUNITY): Payer: Self-pay | Admitting: Psychology

## 2019-08-04 ENCOUNTER — Ambulatory Visit: Payer: Medicare Other | Admitting: Psychiatry

## 2019-08-07 ENCOUNTER — Other Ambulatory Visit: Payer: Self-pay

## 2019-08-07 ENCOUNTER — Ambulatory Visit (INDEPENDENT_AMBULATORY_CARE_PROVIDER_SITE_OTHER): Payer: Medicare Other | Admitting: Psychiatry

## 2019-08-07 DIAGNOSIS — F411 Generalized anxiety disorder: Secondary | ICD-10-CM | POA: Diagnosis not present
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
## 2019-08-11 DIAGNOSIS — G4700 Insomnia, unspecified: Secondary | ICD-10-CM | POA: Diagnosis not present

## 2019-08-11 DIAGNOSIS — Z87898 Personal history of other specified conditions: Secondary | ICD-10-CM | POA: Diagnosis not present

## 2019-08-11 DIAGNOSIS — I10 Essential (primary) hypertension: Secondary | ICD-10-CM | POA: Diagnosis not present

## 2019-08-11 DIAGNOSIS — E559 Vitamin D deficiency, unspecified: Secondary | ICD-10-CM | POA: Diagnosis not present

## 2019-08-11 DIAGNOSIS — E7800 Pure hypercholesterolemia, unspecified: Secondary | ICD-10-CM | POA: Diagnosis not present

## 2019-08-11 DIAGNOSIS — F419 Anxiety disorder, unspecified: Secondary | ICD-10-CM | POA: Diagnosis not present

## 2019-08-13 ENCOUNTER — Other Ambulatory Visit: Payer: Self-pay

## 2019-08-13 ENCOUNTER — Ambulatory Visit (INDEPENDENT_AMBULATORY_CARE_PROVIDER_SITE_OTHER): Payer: Medicare Other | Admitting: Psychiatry

## 2019-08-13 DIAGNOSIS — F411 Generalized anxiety disorder: Secondary | ICD-10-CM

## 2019-08-13 NOTE — Progress Notes (Signed)
[facility] Counsel[facility] Initial Adult Exam  Name: Kathleen Robles Date: 08/13/2019 MRN: [id number] DOB: 1946-06-30 PCP: Leighton Ruff, MD  Time spent:  38minutes   1:00pm to 2:00pm   Reason for Visit /Presenting Problem/ Symptoms:  Lot of Anxiety, "mood swings sometimes" but mostly anxiety, asks herself a lot of "what if's" which adds to the anxiety  Mental Status Exam:   Appearance:   Casual     Behavior:  Appropriate and Sharing  Motor:  Normal  Speech/Language:   Clear and Coherent  Affect:  Anxious  Mood:  anxious  Thought process:  normal  Thought content:    WNL  Sensory/Perceptual disturbances:    WNL  Orientation:  oriented to person, place, time/date, situation, day of week, month of year and year  Attention:  easily distracted at times, per patient  Concentration:  Fair and easily distracted, per patient  Memory:  Immediate;   Fair and patient reports some immediate memory issues per patient report  Fund of knowledge:   Good  Insight:    Good  Judgment:   Good  Impulse Control:  Good    Risk Assessment: Danger to Self:  No Self-injurious Behavior: No Danger to Others: No Duty to Warn:no Physical Aggression / Violence:No  Access to Firearms a concern: No  Gang Involvement:No    Subjective:   Patient in today with symptoms as noted above.  Quite anxious and some of it seems to be related to the recent, quick sale of her house and all the things she feels she needs to do before closing on the house. Remains on Zoloft "to help my anxiety" per Dr. Earle Gell. Says it may be helping a little bit, not sure. House situation is stressful as well as "decision-making on my own" is stressful for me.  Reports "some up and down-ness like a bungee cord in her mood at times as well as merry-go-round going in circles when anxious."  "Today am more on the merry-go-round."   Frequently asking "what ifs", and discussed how this is not helpful to her. Talked today about strategies  that can help her manage anxiety more effectively, particularly meditation, slow deep breathing exercises, and some CBT to change anxiety-producing thoughts, and demonstrated these in session today.  Patient has good attitude and is receptive however doubts herself some due to "having been anxious for quite a while and it has worsened before I can to therapy."  Reassured her that is often the case with people seeking help in therapy and there is definitely optimism and hope for her getting better. Goal review with progress noted.  Homework : Slow deep breathing exercises (focus on how she's controlling breathing) Meditation Journaling Alternate (later): Thought interruption and release Limit alcohol  Let go of the "what if's"  *Support Network: Her 3 adult children, ages 24,40,44 (living in [place], [place], and [place]) Has 1 friend that she includes in her support network.    Diagnoses:    ICD-10-CM   1. Generalized anxiety disorder  F41.1      Plan of Care:    Completed goal plan.  Patient admits that the main barrier to achieving her goals is Herself and that she need to refrain from self-sabotage. Homework assigned re: goals, strengths, and motivation. Return in 1-2 wks.   **Goal plan not signed on computer screen due to coronavirus.   Shanon Ace, LCSW

## 2019-08-19 ENCOUNTER — Ambulatory Visit (INDEPENDENT_AMBULATORY_CARE_PROVIDER_SITE_OTHER): Payer: Medicare Other | Admitting: Psychiatry

## 2019-08-19 ENCOUNTER — Other Ambulatory Visit: Payer: Self-pay

## 2019-08-19 DIAGNOSIS — F411 Generalized anxiety disorder: Secondary | ICD-10-CM | POA: Diagnosis not present

## 2019-08-19 NOTE — Progress Notes (Signed)
[facility] Counselor/Therapist Progress Note  Name: Kathleen Robles Date: 08/19/2019 MRN: [id number] DOB: 1946-09-07 PCP: Juluis RainierBarnes, Elizabeth, MD  Time spent:  60minutes   10:00am to 11:00am  Symptoms:  Lot of Anxiety, "mood swings sometimes" but mostly anxiety, asks herself a lot of "what if's" which adds to the anxiety   Mental Status Exam:   Appearance:   Casual     Behavior:  Appropriate and Sharing  Motor:  Normal  Speech/Language:   Clear and Coherent  Affect:  Anxious  Mood:  anxious  Thought process:  normal  Thought content:    WNL  Sensory/Perceptual disturbances:    WNL  Orientation:  oriented to person, place, time/date, situation, day of week, month of year and year  Attention:  easily distracted at times, per patient  Concentration:  Fair and easily distracted, per patient  Memory:  Immediate;   Fair and patient reports some immediate memory issues per patient report  Fund of knowledge:   Good  Insight:    Good  Judgment:   Good  Impulse Control:  Good    Risk Assessment: Danger to Self:  No Self-injurious Behavior: No Danger to Others: No Duty to Warn:no Physical Aggression / Violence:No  Access to Firearms a concern: No  Gang Involvement:No    Subjective:   Patient in today with symptoms as noted above. Very anxious and upset over some personal issues and an unexpected change with the sale of her home, where there had already been a lot of stress. Thinking pattern has been more of the "circular thinking" that she's had before, especially when under more stress.  Her heightened anxiety has more recently interfered with her interactions with others, making decisions, and created an excess of fear and worry for patient. Accepting things out of her control and focusing more on what's in her control is beginning to help her some, as we processed this today and it seemed to be well received by patient.  States she has not had ups and downs in mood but definitely going  "round and round" at times when she overthinks or asks a lot of "what if's".    Working to interrupt the circular thinking and to stop asking herself so many what-ifs, as it only serves to increase her anxiety. Talked today about strategies that can help her manage anxiety more effectively, particularly meditation, slow deep breathing exercises, and some CBT to change anxiety-producing thoughts, and demonstrated these in session again today.  Patient has good attitude and is receptive however doubts herself some due to "having been anxious for quite a while and it has worsened before I came to therapy."  Again, reassured her that is often the case with people seeking help in therapy and there is definitely optimism and hope for her getting better. Today is clearly an example of her work paying off as she is noting some progress in some areas, even though it's been a stressful week.  Goal review with progress noted.  Homework :  (continue) Slow deep breathing exercises (focus on how she's controlling breathing) Meditation Journaling Alternate (later): Thought interruption and release Limit alcohol  Let go of the "what if's"  *Support Network: Her 3 adult children, ages 1648,40,44 (living in [place], [place], and [place]) Has 1 friend that she includes in her support network.    Diagnoses:    ICD-10-CM   1. Generalized anxiety disorder  F41.1      Plan of Care:   Review of goals per Flowsheets  and progress noted.  Patient admits that the main barrier to achieving her goals is Herself and that she need to refrain from self-sabotage. Homework assigned re: thought patterns,strengths,  and motivation. Return in 1-2 wks.   **Goal plan not signed on computer screen due to coronavirus.   Shanon Ace, LCSW

## 2019-08-27 ENCOUNTER — Encounter

## 2019-09-04 ENCOUNTER — Ambulatory Visit: Payer: Medicare Other | Admitting: Psychiatry

## 2019-09-18 ENCOUNTER — Ambulatory Visit (INDEPENDENT_AMBULATORY_CARE_PROVIDER_SITE_OTHER): Payer: Medicare Other | Admitting: Psychiatry

## 2019-09-18 ENCOUNTER — Other Ambulatory Visit: Payer: Self-pay

## 2019-09-18 DIAGNOSIS — F411 Generalized anxiety disorder: Secondary | ICD-10-CM | POA: Diagnosis not present

## 2019-09-18 NOTE — Progress Notes (Signed)
      [facility] Counselor/Therapist Progress Note  Patient ID: Kathleen Robles, MRN: [id number],    Date: 09/18/2019  Time Spent:  60 minutes     1:00pm to 12:00noon  Treatment Type: Individual Therapy  Reported Symptoms:   anxiety, stressed, frustration, having some elevated blood pressure issues and her PCP Dr. Earle Gell is managing meds on this.  Mental Status Exam:  Appearance:   Casual     Behavior:  Appropriate and Sharing  Motor:  Normal  Speech/Language:   Normal Rate  Affect:  anxiety and stressed  Mood:  anxious and stressed  Thought process:  goal directed  Thought content:    WNL  Sensory/Perceptual disturbances:    WNL  Orientation:  oriented to person, place, time/date, situation, day of week, month of year and year  Attention:  Good  Concentration:  Good  Memory:  WNL  Fund of knowledge:   Good  Insight:    Good  Judgment:   Good  Impulse Control:  Good   Risk Assessment: Danger to Self:  No Self-injurious Behavior: No Danger to Others: No Duty to Warn:no Physical Aggression / Violence:No  Access to Firearms a concern: No  Gang Involvement:No   Subjective:  Patient in today with anxiety, feeling stressed, and frustrated. Did close on her townhome and to close on her house this coming week. Having some BP elevation as noted above and PCP is managing meds.   Feeling some relief that she has moved into her townhome and is starting to settle in. 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 Did well in not drinking any alcohol during this stressful time!  Interventions: Cognitive Behavioral Therapy and Ego-Supportive  Diagnosis:   ICD-10-CM   1. Generalized anxiety disorder  F41.1     Plan:   Patient not signing tx plan on computer screen due to [place].  Treatment Goals: Goals may remain on tx plan as patient works on strategies to achieve her goals.  Today we changed up her goal plan some and strategies.   Long term goal: Reduce overall level, frequency,  and intensity of anxiety so that daily functioning is not impaired.  Short term goal: Increase understanding of beliefs and messages that produce worry and anxiety.   Strategies: 1)Help patient develop reality-based, positive cognitive messages that do not support anxiety. 2)Teach client to implement a thought-stopping technique to help interrupt negative thought patterns  Progress: Patient in today and is anxious but more engaged as we spoke.  Motivated for goal-directed work.  Focused on her goals and really needed to talk though her anxiety and stress over move last week. Did not drink during that stressful time of closing on townhome and moving into it last week. Patient is to be recognizing and monitoring her automatic thoughts, trying to interrupt them and stop their progression.  Will follow up on this next session.  On 1-10 anxiety scale , was a 9 earlier in the week but is a "6" today.  Goal review and progress/efforts noted.    Next appt within 2 wks.  Shanon Ace, LCSW

## 2019-09-23 DIAGNOSIS — G4700 Insomnia, unspecified: Secondary | ICD-10-CM | POA: Diagnosis not present

## 2019-09-23 DIAGNOSIS — E7800 Pure hypercholesterolemia, unspecified: Secondary | ICD-10-CM | POA: Diagnosis not present

## 2019-09-23 DIAGNOSIS — Z79899 Other long term (current) drug therapy: Secondary | ICD-10-CM | POA: Diagnosis not present

## 2019-09-23 DIAGNOSIS — I10 Essential (primary) hypertension: Secondary | ICD-10-CM | POA: Diagnosis not present

## 2019-09-23 DIAGNOSIS — E559 Vitamin D deficiency, unspecified: Secondary | ICD-10-CM | POA: Diagnosis not present

## 2019-09-23 DIAGNOSIS — F419 Anxiety disorder, unspecified: Secondary | ICD-10-CM | POA: Diagnosis not present

## 2019-09-23 DIAGNOSIS — Z87898 Personal history of other specified conditions: Secondary | ICD-10-CM | POA: Diagnosis not present

## 2019-10-02 ENCOUNTER — Other Ambulatory Visit: Payer: Self-pay

## 2019-10-02 ENCOUNTER — Ambulatory Visit (INDEPENDENT_AMBULATORY_CARE_PROVIDER_SITE_OTHER): Payer: Medicare Other | Admitting: Psychiatry

## 2019-10-02 DIAGNOSIS — F411 Generalized anxiety disorder: Secondary | ICD-10-CM

## 2019-10-02 NOTE — Progress Notes (Signed)
[facility] Counselor/Therapist Progress Note  Patient ID: Kathleen Robles, MRN: [id number],    Date: 10/02/2019  Time Spent: 60 minutes   1:00pm to 2:00pm  Treatment Type: Individual Therapy  Reported Symptoms: anxiety, stressed, depression (more so at night)  Mental Status Exam:  Appearance:   Casual     Behavior:  Appropriate and Sharing  Motor:  Normal  Speech/Language:   Normal Rate  Affect:  Depressed and anxious  Mood:  anxious and depressed  Thought process:  goal directed  Thought content:    WNL  Sensory/Perceptual disturbances:    WNL  Orientation:  oriented to person, place, time/date, situation, day of week, month of year and year  Attention:  Good  Concentration:  Good  Memory:  WNL  Fund of knowledge:   Good  Insight:    Good  Judgment:   Good  Impulse Control:  Good   Risk Assessment: Danger to Self:  No Self-injurious Behavior: No Danger to Others: No Duty to Warn:no Physical Aggression / Violence:No  Access to Firearms a concern: No  Gang Involvement:No   Subjective: Patient in office today reporting anxiety, stressed, depression (which she states is usually more of a problem at night).    Interventions: Cognitive Behavioral Therapy and Solution-Oriented/Positive Psychology  Diagnosis:   ICD-10-CM   1. Generalized anxiety disorder  F41.1      Plan:   Patient not signing tx plan on computer screen due to [place].  Treatment Goals: Goals may remain on tx plan as patient works on strategies to achieve her goals.  Progress is documented each session in the "Progress" section of Plan.  Long term goal: Reduce overall level, frequency, and intensity of anxiety so that daily functioning is not impaired.  Short term goal: Increase understanding of beliefs and messages that produce worry and anxiety.   Strategies: 1)Help patient develop reality-based, positive cognitive messages that do not support anxiety. 2)Teach client to implement a  thought-stopping technique to help interrupt negative thought patterns  PROGRESS: Patient motivated and engaging well.  Depression is lessening some and her affect is more full and includes more smiling. Had some success with homework of monitoring and identifying some of her automatic thoughts.  Also was more aware of her negative thought patterns which she tried to interrupt "but wasn't always successful."  She gave examples of some of her negative thoughts including: "I can't have 1 holiday with all my 3 kids together." She explained that she knows that there may be a time when her adult kids do come together but she assumes that may not happen.  Regardless of whether they do or not, it is an example of a negative thought that does feed her anxiety and depression, plus it is an assumption of what patient thinks rather than a fact. Talked through 2 more examples and patient seemed to understand.  She acknowledged that she often has anxious thoughts and is to practice self-monitoring thoughts over the next couple weeks, as as she identifies them, then interrupt them and try changing them to more positive, reality-based thoughts, which will impact her feelings in positive ways.  Also to self-monitor her worrying, interrupt it, and choose to be more a person of concern rather than a "worrier"-- that way she leaves the door open for things to go well versus always looking for what may go badly.  Patient  Strongly relates to the over-worrying also and will follow through with suggested self-monitoring and working to  convert her negative thoughts to more positive, self-affirming thoughts. On 1-10 anxiety scale today, rates herself as a "still a 6" as moving has been quite stressful.  Goal review and progress noted with patient.   Next appt within 2 weeks.   Mathis Fare, LCSW

## 2019-10-15 ENCOUNTER — Ambulatory Visit (INDEPENDENT_AMBULATORY_CARE_PROVIDER_SITE_OTHER): Payer: Medicare Other | Admitting: Psychiatry

## 2019-10-15 ENCOUNTER — Other Ambulatory Visit: Payer: Self-pay

## 2019-10-15 DIAGNOSIS — F411 Generalized anxiety disorder: Secondary | ICD-10-CM

## 2019-10-15 NOTE — Progress Notes (Signed)
[facility] Counselor/Therapist Progress Note  Patient ID: Kathleen Robles, MRN: [id number],    Date: 10/15/2019  Time Spent: 60 minutes   2:00pm to 3:00pm   Treatment Type: Individual Therapy  Reported Symptoms: depression (more at night), anxiety, stressed  Mental Status Exam:  Appearance:   Casual     Behavior:  Appropriate and Sharing  Motor:  Normal  Speech/Language:   Normal Rate  Affect:  Depressed and anxious  Mood:  anxious and depressed  Thought process:  goal directed  Thought content:    WNL  Sensory/Perceptual disturbances:    WNL  Orientation:  oriented to person, place, time/date, situation, day of week, month of year and year  Attention:  Good  Concentration:  Good  Memory:  WNL  Fund of knowledge:   Good  Insight:    Good  Judgment:   Good  Impulse Control:  Good   Risk Assessment: Danger to Self:  No Self-injurious Behavior: No Danger to Others: No Duty to Warn:no Physical Aggression / Violence:No  Access to Firearms a concern: No  Gang Involvement:No   Subjective:  Patient in today with depression and anxiety, with some improvement in both. Anxiety remains most prominent symptom. Depressed feelings have significantly decreased.  Seemed to be feeling more connected to people, programs, and in therapy.  Staying very busy with part-time job at [employer].  Sharing some of her AA material on which she is working, that she found correlates with some of what we work on in therapy.  Interventions: Cognitive Behavioral Therapy and Solution-Oriented/Positive Psychology  Diagnosis:   ICD-10-CM   1. Generalized anxiety disorder  F41.1      Plan: Patient not signing tx plan on computer screen due to COVID.  Treatment Goals: Goals may remain on tx plan as patient works on strategies to achieve her goals. Progress is documented each session in the "Progress" section of Plan.  Long term goal: Reduce overall level, frequency, and  intensity of anxiety so that daily functioning is not impaired.  Short term goal: Increase understanding of beliefs and messages that produce worry and anxiety, as evidenced by patient successfully reporting an increased understanding of the thoughts and beliefs that lead to worry and anxiety and successfully change her beliefs and thoughts that do not support anxiety.  Strategies: 1)Help patient develop reality-based, positive cognitive messages that do not support anxiety. 2)Teach client to implement a thought-stopping technique to help interrupt negative thought patterns  PROGRESS: Patient continues her work on both short and long term goals. Is using some calming skills as well as some of her AA notes that is helping her make gains in her therapy goals. Talked about some of her frequent anxious thoughts including: "my son could get cancer again and die before me, and "I need to always say Yes to requests from other people" (states I did this for years and years because I liked the attention and pat on the back I got from it."  Looked at these and a few other examples and worked with them in session today, working to change the anxious thought to healthier, more positive thoughts that do not lead to anxiety. Specific focus on following through on the step of replacing the anxious thoughts as that seems to be where patient is having difficulty following through. Also worked on the thought-stopping/interrupting step as she felt she may not have been putting enough emphasis on that step before trying to move forward. She is to  do at least 2 more anxious thought "changes" independently as homework before next session. Goal review with patient and progress noted.    Next appt within 3 weeks.   Shanon Ace, LCSW

## 2019-10-16 ENCOUNTER — Ambulatory Visit: Payer: Medicare Other | Admitting: Psychiatry

## 2019-10-30 ENCOUNTER — Ambulatory Visit: Payer: Medicare Other | Admitting: Psychiatry

## 2019-11-11 ENCOUNTER — Ambulatory Visit: Payer: Medicare Other | Admitting: Psychiatry

## 2019-11-13 ENCOUNTER — Ambulatory Visit: Payer: Medicare Other | Admitting: Psychiatry

## 2019-11-27 ENCOUNTER — Ambulatory Visit (INDEPENDENT_AMBULATORY_CARE_PROVIDER_SITE_OTHER): Payer: Medicare Other | Admitting: Psychiatry

## 2019-11-27 ENCOUNTER — Other Ambulatory Visit: Payer: Self-pay

## 2019-11-27 DIAGNOSIS — F411 Generalized anxiety disorder: Secondary | ICD-10-CM

## 2019-11-27 NOTE — Progress Notes (Signed)
      [facility] Counselor/Therapist Progress Note  Patient ID: Kathleen Robles, MRN: [id number],    Date: 11/27/2019  Time Spent: 60 minutes   1:00pm to 2:00pm  Treatment Type: Individual Therapy  Reported Symptoms: anxiety (much better)  Mental Status Exam:  Appearance:   Casual     Behavior:  Appropriate and Sharing  Motor:  Normal  Speech/Language:   Normal  Affect:  anxious  Mood:  anxious  Thought process:  normal  Thought content:    WNL  Sensory/Perceptual disturbances:    WNL  Orientation:  oriented to person, place, time/date, situation, day of week, month of year and year  Attention:  Good  Concentration:  Good  Memory:  WNL  Fund of knowledge:   Good  Insight:    Good  Judgment:   Good  Impulse Control:  Good   Risk Assessment: Danger to Self:  No Self-injurious Behavior: No Danger to Others: No Duty to Warn:no Physical Aggression / Violence:No  Access to Firearms a concern: No  Gang Involvement:No   Subjective: Patient in today with anxiety, and is some better.  Had good Thanksgiving with family in [place] and didn't seem concerned re: virus although did use facial mask.  Interventions: Solution-Oriented/Positive Psychology and Ego-Supportive  Diagnosis:   ICD-10-CM   1. Generalized anxiety disorder  F41.1     Plan: Patient not signing tx plan on computer screen due to [place].  Treatment Goals: Goals may remain on tx plan as patient works on strategies to achieve her goals.Progress is documented each session in the "Progress" section of Plan.  Long term goal: Reduce overall level, frequency, and intensity of anxiety so that daily functioning is not impaired.  Short term goal: Increase understanding of beliefs and messages that produce worry and anxiety, as evidenced by patient successfully reporting an increased understanding of the thoughts and beliefs that lead to worry and anxiety and successfully change her beliefs and thoughts that  do not support anxiety.  Strategies: 1)Help patient develop reality-based, positive cognitive messages that do not support anxiety. 2)Teach client to implement a thought-stopping technique to help interrupt negative thought patterns  PROGRESS: Patient has continued to work on her goals and anxiety has noticeably decreased. Her overall attitude is much more positive.  Productivity improved. Motivation improved, along with her outlook and energy with is also improved.  She has made significant progress on goal.  With increased COVID #s, and her significant progress made, she is going to hold off on scheduling right now and if things continue to go well, that's good.  If she feels she's slipping, she was quick to say she would definitely call back for appt.  To continue her regular involvement with AA.  Will call later for appt as needed.     Kathleen Ace, LCSW

## 2019-12-11 ENCOUNTER — Ambulatory Visit: Payer: Medicare Other | Admitting: Psychiatry

## 2020-02-15 ENCOUNTER — Emergency Department (HOSPITAL_COMMUNITY): Payer: Medicare Other

## 2020-02-15 ENCOUNTER — Encounter (HOSPITAL_COMMUNITY): Payer: Self-pay | Admitting: Emergency Medicine

## 2020-02-15 ENCOUNTER — Emergency Department (HOSPITAL_COMMUNITY)
Admission: EM | Admit: 2020-02-15 | Discharge: 2020-02-15 | Disposition: A | Discharge status: Home / Self Care | Payer: Medicare Other | Attending: Emergency Medicine | Admitting: Emergency Medicine

## 2020-02-15 DIAGNOSIS — S3991XA Unspecified injury of abdomen, initial encounter: Secondary | ICD-10-CM | POA: Diagnosis not present

## 2020-02-15 DIAGNOSIS — M545 Low back pain, unspecified: Secondary | ICD-10-CM

## 2020-02-15 DIAGNOSIS — R55 Syncope and collapse: Secondary | ICD-10-CM | POA: Diagnosis not present

## 2020-02-15 DIAGNOSIS — S32040A Wedge compression fracture of fourth lumbar vertebra, initial encounter for closed fracture: Secondary | ICD-10-CM | POA: Diagnosis not present

## 2020-02-15 DIAGNOSIS — R52 Pain, unspecified: Secondary | ICD-10-CM | POA: Diagnosis not present

## 2020-02-15 DIAGNOSIS — I10 Essential (primary) hypertension: Secondary | ICD-10-CM | POA: Diagnosis not present

## 2020-02-15 DIAGNOSIS — S0990XA Unspecified injury of head, initial encounter: Secondary | ICD-10-CM | POA: Diagnosis not present

## 2020-02-15 DIAGNOSIS — R0902 Hypoxemia: Secondary | ICD-10-CM | POA: Diagnosis not present

## 2020-02-15 LAB — CBC WITH DIFFERENTIAL/PLATELET
Abs Immature Granulocytes: 0.16 10*3/uL — ABNORMAL HIGH (ref 0.00–0.07)
Basophils Absolute: 0.1 10*3/uL (ref 0.0–0.1)
Basophils Relative: 1 %
Eosinophils Absolute: 0.1 10*3/uL (ref 0.0–0.5)
Eosinophils Relative: 2 %
HCT: 36.8 % (ref 36.0–46.0)
Hemoglobin: 12 g/dL (ref 12.0–15.0)
Immature Granulocytes: 2 %
Lymphocytes Relative: 20 %
Lymphs Abs: 1.8 10*3/uL (ref 0.7–4.0)
MCH: 31.3 pg (ref 26.0–34.0)
MCHC: 32.6 g/dL (ref 30.0–36.0)
MCV: 95.8 fL (ref 80.0–100.0)
Monocytes Absolute: 0.4 10*3/uL (ref 0.1–1.0)
Monocytes Relative: 5 %
Neutro Abs: 6.4 10*3/uL (ref 1.7–7.7)
Neutrophils Relative %: 70 %
Platelets: 219 10*3/uL (ref 150–400)
RBC: 3.84 MIL/uL — ABNORMAL LOW (ref 3.87–5.11)
RDW: 11.8 % (ref 11.5–15.5)
WBC: 8.9 10*3/uL (ref 4.0–10.5)
nRBC: 0 % (ref 0.0–0.2)

## 2020-02-15 LAB — BASIC METABOLIC PANEL
Anion gap: 13 (ref 5–15)
BUN: 13 mg/dL (ref 8–23)
CO2: 23 mmol/L (ref 22–32)
Calcium: 8.8 mg/dL — ABNORMAL LOW (ref 8.9–10.3)
Chloride: 95 mmol/L — ABNORMAL LOW (ref 98–111)
Creatinine, Ser: 0.82 mg/dL (ref 0.44–1.00)
GFR calc Af Amer: >60 mL/min (ref >60)
GFR calc non Af Amer: >60 mL/min (ref >60)
Glucose, Bld: 116 mg/dL — ABNORMAL HIGH (ref 70–99)
Potassium: 4.1 mmol/L (ref 3.5–5.1)
Sodium: 131 mmol/L — ABNORMAL LOW (ref 135–145)

## 2020-02-15 LAB — CBG MONITORING, ED: Glucose-Capillary: 85 mg/dL (ref 70–99)

## 2020-02-15 LAB — ETHANOL: Alcohol, Ethyl (B): <10 mg/dL (ref <10)

## 2020-02-15 MED ORDER — SODIUM CHLORIDE 0.9 % IV BOLUS
1000.0000 mL | Freq: Once | INTRAVENOUS | Status: AC
  Administered 2020-02-15: 1000 mL via INTRAVENOUS

## 2020-02-15 MED ORDER — IOHEXOL 300 MG/ML  SOLN
100.0000 mL | Freq: Once | INTRAMUSCULAR | Status: AC | PRN
  Administered 2020-02-15: 100 mL via INTRAVENOUS

## 2020-02-15 MED ORDER — IBUPROFEN 400 MG PO TABS: 400.0000 mg | ORAL_TABLET | Freq: Three times a day (TID) | ORAL | 0 refills | Status: AC | PRN
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
## 2020-02-15 NOTE — Discharge Instructions (Addendum)
Follow-up with your doctor regarding the fainting spell.  Follow-up with the neurosurgeons office regarding the spine fracture.  Wear the brace as directed.  Take medications for pain as needed.
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
## 2020-02-15 NOTE — ED Notes (Signed)
Transport to CT
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
## 2020-02-15 NOTE — ED Notes (Signed)
Patient verbalizes PCP followup and discharge instructions. A/O X 4, ambulatory, tolerated food and liquids. Back brace in place. Ready for discharge when family arrives.
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
## 2020-02-15 NOTE — ED Notes (Signed)
Pt's CBG result was 85. Informed [place] - Charity fundraiser.
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
## 2020-02-15 NOTE — ED Notes (Signed)
Purewick placed on pt. 

## 2020-02-16 ENCOUNTER — Encounter (HOSPITAL_COMMUNITY): Payer: Self-pay | Admitting: Psychology

## 2020-02-18 DIAGNOSIS — E871 Hypo-osmolality and hyponatremia: Secondary | ICD-10-CM | POA: Diagnosis not present

## 2020-02-18 DIAGNOSIS — R55 Syncope and collapse: Secondary | ICD-10-CM | POA: Diagnosis not present

## 2020-02-18 DIAGNOSIS — S32000A Wedge compression fracture of unspecified lumbar vertebra, initial encounter for closed fracture: Secondary | ICD-10-CM | POA: Diagnosis not present

## 2020-02-19 ENCOUNTER — Telehealth: Payer: Self-pay

## 2020-02-19 DIAGNOSIS — E871 Hypo-osmolality and hyponatremia: Secondary | ICD-10-CM | POA: Diagnosis not present

## 2020-02-19 NOTE — Telephone Encounter (Signed)
Referral notes sent from [facility] at [place], [place], Phone #: [phone], Fax #: [phone]   Notes sent to scheduling

## 2020-02-22 DIAGNOSIS — E871 Hypo-osmolality and hyponatremia: Secondary | ICD-10-CM | POA: Diagnosis not present

## 2020-03-01 DIAGNOSIS — M545 Low back pain: Secondary | ICD-10-CM | POA: Diagnosis not present

## 2020-03-01 DIAGNOSIS — M4856XD Collapsed vertebra, not elsewhere classified, lumbar region, subsequent encounter for fracture with routine healing: Secondary | ICD-10-CM | POA: Diagnosis not present

## 2020-03-01 DIAGNOSIS — E871 Hypo-osmolality and hyponatremia: Secondary | ICD-10-CM | POA: Diagnosis not present

## 2020-03-15 ENCOUNTER — Encounter: Payer: Self-pay | Admitting: *Deleted

## 2020-03-15 ENCOUNTER — Encounter: Payer: Self-pay | Admitting: Cardiovascular Disease

## 2020-03-15 ENCOUNTER — Other Ambulatory Visit: Payer: Self-pay

## 2020-03-15 ENCOUNTER — Ambulatory Visit (INDEPENDENT_AMBULATORY_CARE_PROVIDER_SITE_OTHER): Payer: Medicare Other | Admitting: Cardiovascular Disease

## 2020-03-15 ENCOUNTER — Encounter (INDEPENDENT_AMBULATORY_CARE_PROVIDER_SITE_OTHER): Payer: Self-pay

## 2020-03-15 VITALS — BP 122/84 | HR 78 | Ht 64.0 in | Wt 113.2 lb

## 2020-03-15 DIAGNOSIS — I10 Essential (primary) hypertension: Secondary | ICD-10-CM | POA: Diagnosis not present

## 2020-03-15 DIAGNOSIS — R55 Syncope and collapse: Secondary | ICD-10-CM | POA: Diagnosis not present
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
## 2020-03-15 NOTE — Patient Instructions (Addendum)
Increase your intake of fluids (water with electrolyte tabs like Nun tablets, or liquid IV ) , protein ( hard boiled eggs, chicken, fish) , and a electrolytes ( V-8 juice, salt, potassium chloride  which is sold as No-Salt  Medication Instructions:  Your physician recommends that you continue on your current medications as directed. Please refer to the Current Medication list given to you today.  *If you need a refill on your cardiac medications before your next appointment, please call your pharmacy*   Lab Work: None Ordered If you have labs (blood work) drawn today and your tests are completely normal, you will receive your results only by: Marland Kitchen MyChart Message (if you have MyChart) OR . A paper copy in the mail If you have any lab test that is abnormal or we need to change your treatment, we will call you to review the results.   Testing/Procedures: Your physician has recommended that you wear an event monitor. Event monitors are medical devices that record the heart's electrical activity. Doctors most often [place] these monitors to diagnose arrhythmias. Arrhythmias are problems with the speed or rhythm of the heartbeat. The monitor is a small, portable device. You can wear one while you do your normal daily activities. This is usually used to diagnose what is causing palpitations/syncope (passing out).  Your physician has requested that you have an echocardiogram. Echocardiography is a painless test that uses sound waves to create images of your heart. It provides your doctor with information about the size and shape of your heart and how well your heart's chambers and valves are working. This procedure takes approximately one hour. There are no restrictions for this procedure.     Follow-Up: At [facility], you and your health needs are our priority.  As part of our continuing mission to provide you with exceptional heart care, we have created designated Provider Care Teams.  These Care  Teams include your primary Cardiologist (physician) and Advanced Practice Providers (APPs -  Physician Assistants and Nurse Practitioners) who all work together to provide you with the care you need, when you need it.  We recommend signing up for the patient portal called "MyChart".  Sign up information is provided on this After Visit Summary.  MyChart is used to connect with patients for Virtual Visits (Telemedicine).  Patients are able to view lab/test results, encounter notes, upcoming appointments, etc.  Non-urgent messages can be sent to your provider as well.   To learn more about what you can do with MyChart, go to [email or web address].    Your next appointment:   3 month(s)  The format for your next appointment:   In Person  Provider:   Kristeen Miss, MD

## 2020-03-15 NOTE — Progress Notes (Addendum)
Patient ID: Kathleen Robles, female   DOB: 05-21-1946, 74 y.o.   MRN: [id number] Patient enrolled for [employer] to ship a 30 day cardiac event monitor to her home.  Patient called, she is aware instructions will  mailed and will also be included in her monitor kit.

## 2020-03-21 DIAGNOSIS — M545 Low back pain: Secondary | ICD-10-CM | POA: Diagnosis not present

## 2020-03-21 DIAGNOSIS — M4856XD Collapsed vertebra, not elsewhere classified, lumbar region, subsequent encounter for fracture with routine healing: Secondary | ICD-10-CM | POA: Diagnosis not present

## 2020-03-22 ENCOUNTER — Ambulatory Visit (INDEPENDENT_AMBULATORY_CARE_PROVIDER_SITE_OTHER): Payer: Medicare Other

## 2020-03-22 DIAGNOSIS — R55 Syncope and collapse: Secondary | ICD-10-CM

## 2020-04-01 ENCOUNTER — Ambulatory Visit (HOSPITAL_COMMUNITY): Payer: Medicare Other | Attending: Cardiology

## 2020-04-01 ENCOUNTER — Other Ambulatory Visit: Payer: Self-pay

## 2020-04-01 DIAGNOSIS — R55 Syncope and collapse: Secondary | ICD-10-CM | POA: Diagnosis not present

## 2020-04-18 DIAGNOSIS — M4856XD Collapsed vertebra, not elsewhere classified, lumbar region, subsequent encounter for fracture with routine healing: Secondary | ICD-10-CM | POA: Diagnosis not present

## 2020-04-29 ENCOUNTER — Other Ambulatory Visit: Payer: Self-pay | Admitting: Family Medicine

## 2020-04-29 DIAGNOSIS — E041 Nontoxic single thyroid nodule: Secondary | ICD-10-CM

## 2020-05-10 ENCOUNTER — Ambulatory Visit
Admission: RE | Admit: 2020-05-10 | Discharge: 2020-05-10 | Disposition: A | Discharge status: Home / Self Care | Payer: Medicare Other | Source: Ambulatory Visit | Attending: Family Medicine | Admitting: Family Medicine

## 2020-05-10 DIAGNOSIS — E041 Nontoxic single thyroid nodule: Secondary | ICD-10-CM

## 2020-05-16 DIAGNOSIS — M4856XD Collapsed vertebra, not elsewhere classified, lumbar region, subsequent encounter for fracture with routine healing: Secondary | ICD-10-CM | POA: Diagnosis not present

## 2020-05-18 DIAGNOSIS — R55 Syncope and collapse: Secondary | ICD-10-CM | POA: Diagnosis not present

## 2020-05-18 DIAGNOSIS — E531 Pyridoxine deficiency: Secondary | ICD-10-CM | POA: Diagnosis not present

## 2020-05-18 DIAGNOSIS — R413 Other amnesia: Secondary | ICD-10-CM | POA: Diagnosis not present

## 2020-05-24 ENCOUNTER — Other Ambulatory Visit: Payer: Self-pay | Admitting: Specialist

## 2020-05-24 DIAGNOSIS — R55 Syncope and collapse: Secondary | ICD-10-CM

## 2020-05-25 DIAGNOSIS — E871 Hypo-osmolality and hyponatremia: Secondary | ICD-10-CM | POA: Diagnosis not present

## 2020-05-25 DIAGNOSIS — N951 Menopausal and female climacteric states: Secondary | ICD-10-CM | POA: Diagnosis not present

## 2020-05-25 DIAGNOSIS — E041 Nontoxic single thyroid nodule: Secondary | ICD-10-CM | POA: Diagnosis not present

## 2020-05-25 DIAGNOSIS — E162 Hypoglycemia, unspecified: Secondary | ICD-10-CM | POA: Diagnosis not present

## 2020-05-25 DIAGNOSIS — M899 Disorder of bone, unspecified: Secondary | ICD-10-CM | POA: Diagnosis not present

## 2020-05-25 DIAGNOSIS — R5381 Other malaise: Secondary | ICD-10-CM | POA: Diagnosis not present

## 2020-05-25 DIAGNOSIS — I10 Essential (primary) hypertension: Secondary | ICD-10-CM | POA: Diagnosis not present

## 2020-05-25 DIAGNOSIS — R7989 Other specified abnormal findings of blood chemistry: Secondary | ICD-10-CM | POA: Diagnosis not present

## 2020-06-13 ENCOUNTER — Ambulatory Visit: Payer: Medicare Other | Admitting: Cardiovascular Disease

## 2020-06-23 ENCOUNTER — Other Ambulatory Visit: Payer: Self-pay

## 2020-06-23 ENCOUNTER — Ambulatory Visit
Admission: RE | Admit: 2020-06-23 | Discharge: 2020-06-23 | Disposition: A | Discharge status: Home / Self Care | Payer: Medicare Other | Source: Ambulatory Visit | Attending: Specialist | Admitting: Specialist

## 2020-06-23 DIAGNOSIS — R55 Syncope and collapse: Secondary | ICD-10-CM

## 2020-07-05 DIAGNOSIS — R55 Syncope and collapse: Secondary | ICD-10-CM | POA: Diagnosis not present

## 2020-07-12 ENCOUNTER — Encounter: Payer: Self-pay | Admitting: Cardiovascular Disease

## 2020-07-12 ENCOUNTER — Other Ambulatory Visit: Payer: Self-pay

## 2020-07-12 ENCOUNTER — Ambulatory Visit (INDEPENDENT_AMBULATORY_CARE_PROVIDER_SITE_OTHER): Payer: Medicare Other | Admitting: Cardiovascular Disease

## 2020-07-12 VITALS — BP 122/86 | HR 74 | Ht 64.0 in | Wt 113.4 lb

## 2020-07-12 DIAGNOSIS — I10 Essential (primary) hypertension: Secondary | ICD-10-CM | POA: Diagnosis not present

## 2020-07-12 DIAGNOSIS — R55 Syncope and collapse: Secondary | ICD-10-CM
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
## 2020-07-12 NOTE — Patient Instructions (Signed)

## 2020-07-13 DIAGNOSIS — R413 Other amnesia: Secondary | ICD-10-CM | POA: Diagnosis not present

## 2020-07-13 DIAGNOSIS — R9082 White matter disease, unspecified: Secondary | ICD-10-CM | POA: Diagnosis not present

## 2020-07-13 DIAGNOSIS — R55 Syncope and collapse: Secondary | ICD-10-CM | POA: Diagnosis not present

## 2020-07-27 DIAGNOSIS — R55 Syncope and collapse: Secondary | ICD-10-CM | POA: Diagnosis not present

## 2020-07-27 DIAGNOSIS — R413 Other amnesia: Secondary | ICD-10-CM | POA: Diagnosis not present

## 2020-07-27 DIAGNOSIS — R410 Disorientation, unspecified: Secondary | ICD-10-CM | POA: Diagnosis not present

## 2020-09-21 DIAGNOSIS — R5381 Other malaise: Secondary | ICD-10-CM | POA: Diagnosis not present

## 2020-09-21 DIAGNOSIS — N959 Unspecified menopausal and perimenopausal disorder: Secondary | ICD-10-CM | POA: Diagnosis not present

## 2020-09-21 DIAGNOSIS — E049 Nontoxic goiter, unspecified: Secondary | ICD-10-CM | POA: Diagnosis not present

## 2020-10-04 DIAGNOSIS — R55 Syncope and collapse: Secondary | ICD-10-CM | POA: Diagnosis not present

## 2020-10-04 DIAGNOSIS — R413 Other amnesia: Secondary | ICD-10-CM | POA: Diagnosis not present

## 2020-11-30 DIAGNOSIS — Z0000 Encounter for general adult medical examination without abnormal findings: Secondary | ICD-10-CM | POA: Diagnosis not present

## 2020-11-30 DIAGNOSIS — Z1389 Encounter for screening for other disorder: Secondary | ICD-10-CM | POA: Diagnosis not present

## 2020-11-30 DIAGNOSIS — Z1211 Encounter for screening for malignant neoplasm of colon: Secondary | ICD-10-CM | POA: Diagnosis not present

## 2020-12-14 ENCOUNTER — Other Ambulatory Visit: Payer: Self-pay | Admitting: Family Medicine

## 2020-12-15 ENCOUNTER — Other Ambulatory Visit: Payer: Self-pay | Admitting: Family Medicine

## 2020-12-15 DIAGNOSIS — E2839 Other primary ovarian failure: Secondary | ICD-10-CM

## 2020-12-15 DIAGNOSIS — Z1239 Encounter for other screening for malignant neoplasm of breast: Secondary | ICD-10-CM

## 2020-12-20 DIAGNOSIS — R519 Headache, unspecified: Secondary | ICD-10-CM | POA: Diagnosis not present

## 2020-12-22 DIAGNOSIS — M792 Neuralgia and neuritis, unspecified: Secondary | ICD-10-CM | POA: Diagnosis not present

## 2020-12-25 DIAGNOSIS — B349 Viral infection, unspecified: Secondary | ICD-10-CM | POA: Diagnosis not present

## 2020-12-25 DIAGNOSIS — Z03818 Encounter for observation for suspected exposure to other biological agents ruled out: Secondary | ICD-10-CM | POA: Diagnosis not present

## 2020-12-25 DIAGNOSIS — R509 Fever, unspecified: Secondary | ICD-10-CM | POA: Diagnosis not present

## 2021-01-02 DIAGNOSIS — N959 Unspecified menopausal and perimenopausal disorder: Secondary | ICD-10-CM | POA: Diagnosis not present

## 2021-01-02 DIAGNOSIS — E049 Nontoxic goiter, unspecified: Secondary | ICD-10-CM | POA: Diagnosis not present

## 2021-01-02 DIAGNOSIS — R5381 Other malaise: Secondary | ICD-10-CM | POA: Diagnosis not present

## 2021-01-02 DIAGNOSIS — I10 Essential (primary) hypertension: Secondary | ICD-10-CM | POA: Diagnosis not present

## 2021-01-05 ENCOUNTER — Encounter (INDEPENDENT_AMBULATORY_CARE_PROVIDER_SITE_OTHER): Payer: Self-pay

## 2021-01-05 ENCOUNTER — Emergency Department (HOSPITAL_COMMUNITY)
Admission: EM | Admit: 2021-01-05 | Discharge: 2021-01-05 | Disposition: A | Discharge status: Home / Self Care | Payer: Medicare Other | Attending: Emergency Medicine | Admitting: Emergency Medicine

## 2021-01-05 ENCOUNTER — Other Ambulatory Visit: Payer: Self-pay

## 2021-01-05 ENCOUNTER — Encounter (HOSPITAL_COMMUNITY): Payer: Self-pay | Admitting: Emergency Medicine

## 2021-01-05 DIAGNOSIS — R799 Abnormal finding of blood chemistry, unspecified: Secondary | ICD-10-CM | POA: Diagnosis not present

## 2021-01-05 DIAGNOSIS — Z5321 Procedure and treatment not carried out due to patient leaving prior to being seen by health care provider: Secondary | ICD-10-CM | POA: Diagnosis not present

## 2021-01-05 LAB — COMPREHENSIVE METABOLIC PANEL
ALT: 18 U/L (ref 0–44)
AST: 29 U/L (ref 15–41)
Albumin: 4.1 g/dL (ref 3.5–5.0)
Alkaline Phosphatase: 56 U/L (ref 38–126)
Anion gap: 11 (ref 5–15)
BUN: 13 mg/dL (ref 8–23)
CO2: 24 mmol/L (ref 22–32)
Calcium: 8.6 mg/dL — ABNORMAL LOW (ref 8.9–10.3)
Chloride: 91 mmol/L — ABNORMAL LOW (ref 98–111)
Creatinine, Ser: 0.71 mg/dL (ref 0.44–1.00)
GFR, Estimated: >60 mL/min (ref >60)
Glucose, Bld: 111 mg/dL — ABNORMAL HIGH (ref 70–99)
Potassium: 4 mmol/L (ref 3.5–5.1)
Sodium: 126 mmol/L — ABNORMAL LOW (ref 135–145)
Total Bilirubin: 0.6 mg/dL (ref 0.3–1.2)
Total Protein: 6.9 g/dL (ref 6.5–8.1)

## 2021-01-05 NOTE — ED Triage Notes (Signed)
Patient sent by integrative doctor for CMP redraw. Labs x1 week ago showed sodium 130, potassium 5.3, and chloride 92. Patient denies complaints.

## 2021-01-05 NOTE — ED Notes (Signed)
Pt stated she was leaving as she has an appointment with her PCP Monday.

## 2021-01-16 DIAGNOSIS — E871 Hypo-osmolality and hyponatremia: Secondary | ICD-10-CM | POA: Diagnosis not present

## 2021-01-16 DIAGNOSIS — R7989 Other specified abnormal findings of blood chemistry: Secondary | ICD-10-CM | POA: Diagnosis not present

## 2021-01-24 DIAGNOSIS — E559 Vitamin D deficiency, unspecified: Secondary | ICD-10-CM | POA: Diagnosis not present

## 2021-01-24 DIAGNOSIS — Z87898 Personal history of other specified conditions: Secondary | ICD-10-CM | POA: Diagnosis not present

## 2021-01-24 DIAGNOSIS — G4700 Insomnia, unspecified: Secondary | ICD-10-CM | POA: Diagnosis not present

## 2021-01-24 DIAGNOSIS — F419 Anxiety disorder, unspecified: Secondary | ICD-10-CM | POA: Diagnosis not present

## 2021-01-24 DIAGNOSIS — Z8679 Personal history of other diseases of the circulatory system: Secondary | ICD-10-CM | POA: Diagnosis not present

## 2021-01-24 DIAGNOSIS — E7800 Pure hypercholesterolemia, unspecified: Secondary | ICD-10-CM | POA: Diagnosis not present

## 2021-01-24 DIAGNOSIS — I10 Essential (primary) hypertension: Secondary | ICD-10-CM | POA: Diagnosis not present

## 2021-01-24 DIAGNOSIS — E2839 Other primary ovarian failure: Secondary | ICD-10-CM | POA: Diagnosis not present

## 2021-01-26 DIAGNOSIS — H1789 Other corneal scars and opacities: Secondary | ICD-10-CM | POA: Diagnosis not present

## 2021-01-26 DIAGNOSIS — H5203 Hypermetropia, bilateral: Secondary | ICD-10-CM | POA: Diagnosis not present

## 2021-01-26 DIAGNOSIS — H52223 Regular astigmatism, bilateral: Secondary | ICD-10-CM | POA: Diagnosis not present

## 2021-01-26 DIAGNOSIS — H524 Presbyopia: Secondary | ICD-10-CM | POA: Diagnosis not present

## 2021-01-26 DIAGNOSIS — H2513 Age-related nuclear cataract, bilateral: Secondary | ICD-10-CM | POA: Diagnosis not present

## 2021-01-27 DIAGNOSIS — G4700 Insomnia, unspecified: Secondary | ICD-10-CM | POA: Diagnosis not present

## 2021-01-27 DIAGNOSIS — Z8679 Personal history of other diseases of the circulatory system: Secondary | ICD-10-CM | POA: Diagnosis not present

## 2021-01-27 DIAGNOSIS — E2839 Other primary ovarian failure: Secondary | ICD-10-CM | POA: Diagnosis not present

## 2021-01-27 DIAGNOSIS — E7800 Pure hypercholesterolemia, unspecified: Secondary | ICD-10-CM | POA: Diagnosis not present

## 2021-01-27 DIAGNOSIS — E559 Vitamin D deficiency, unspecified: Secondary | ICD-10-CM | POA: Diagnosis not present

## 2021-01-27 DIAGNOSIS — I10 Essential (primary) hypertension: Secondary | ICD-10-CM | POA: Diagnosis not present

## 2021-01-27 DIAGNOSIS — Z87898 Personal history of other specified conditions: Secondary | ICD-10-CM | POA: Diagnosis not present

## 2021-01-27 DIAGNOSIS — F419 Anxiety disorder, unspecified: Secondary | ICD-10-CM | POA: Diagnosis not present

## 2021-03-20 DIAGNOSIS — R7989 Other specified abnormal findings of blood chemistry: Secondary | ICD-10-CM | POA: Diagnosis not present

## 2021-03-20 DIAGNOSIS — E875 Hyperkalemia: Secondary | ICD-10-CM | POA: Diagnosis not present
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
## 2021-03-22 IMAGING — CR DG LUMBAR SPINE COMPLETE 4+V
6 series · 6 of 6 positions shown · non-contrast
Comparison: None.

CLINICAL DATA: Low back pain

EXAM:
LUMBAR SPINE - COMPLETE 4+ VIEW

[l-spine ap]
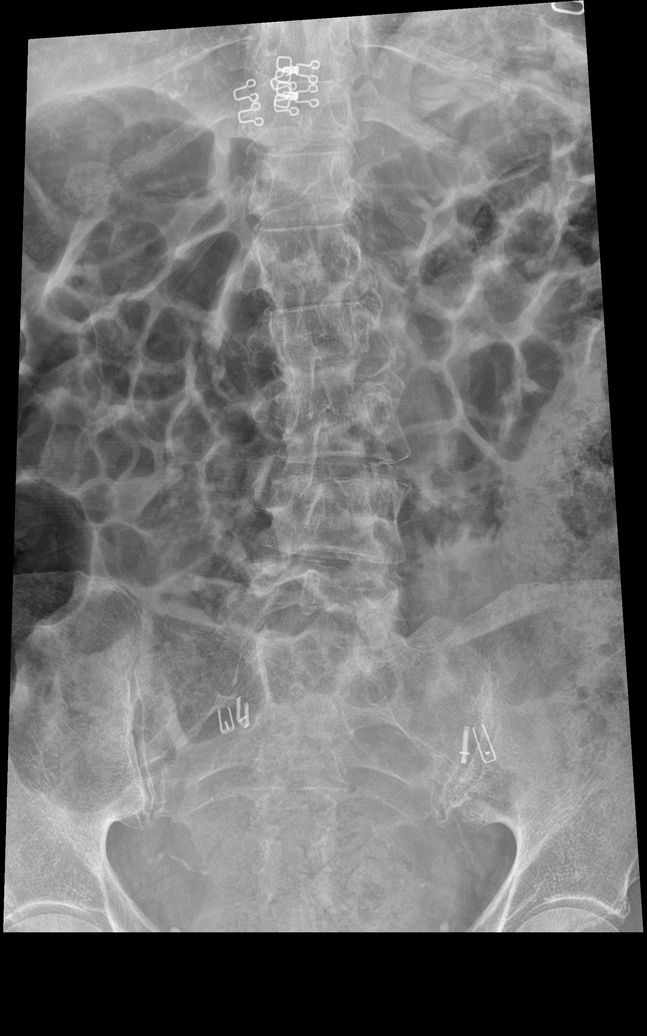

[l-spine obl (1 of 3)]
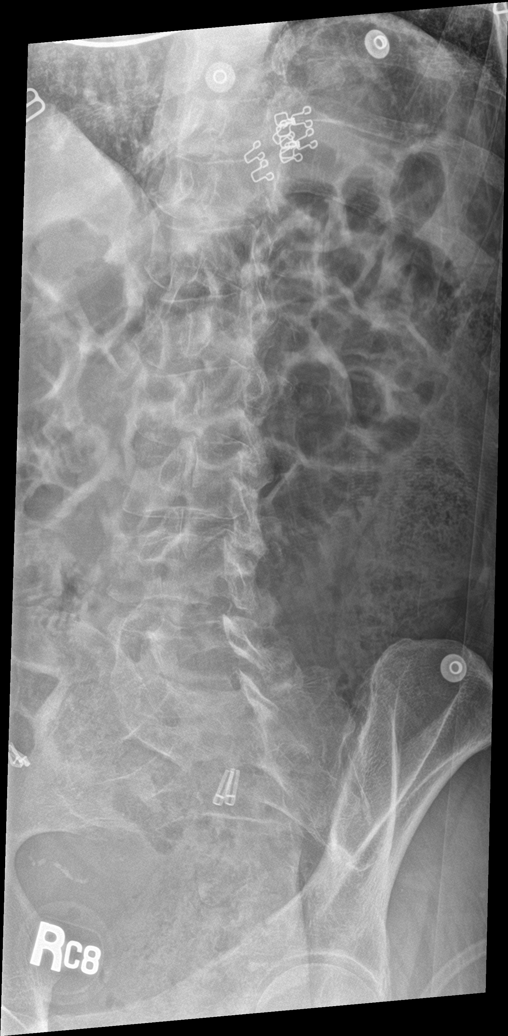

[l-spine obl (2 of 3)]
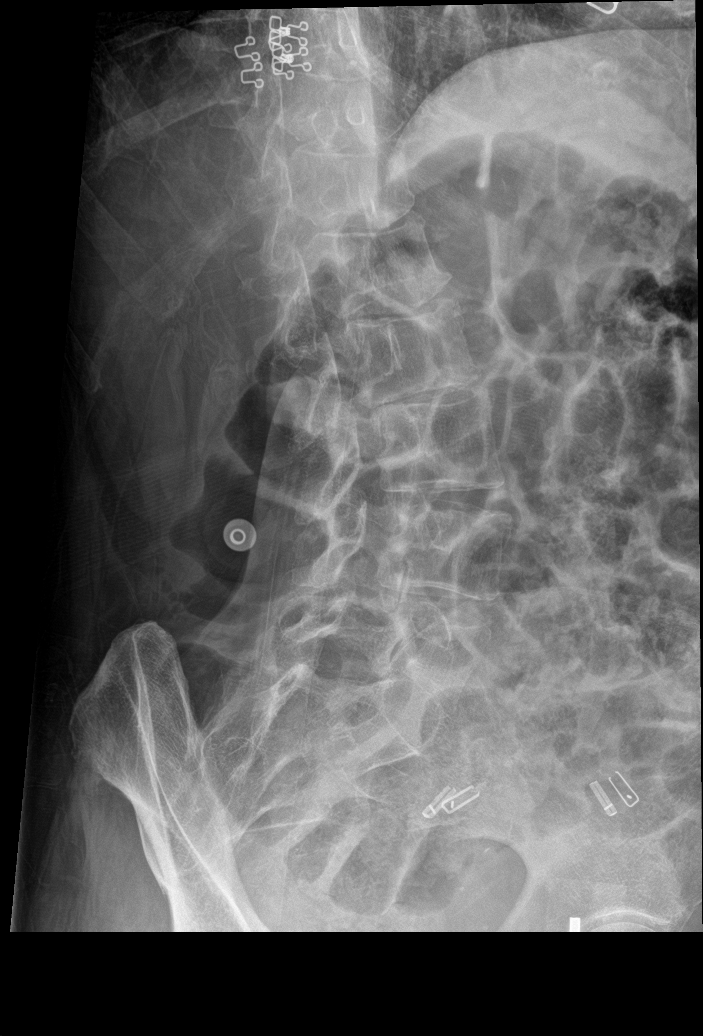

[l-spine lat]
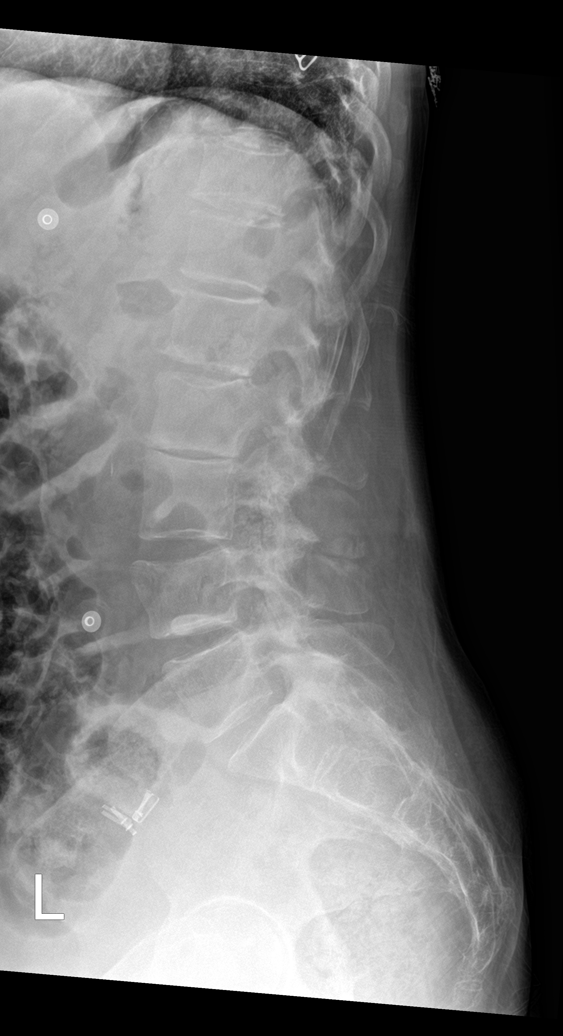

[l-spine spot]
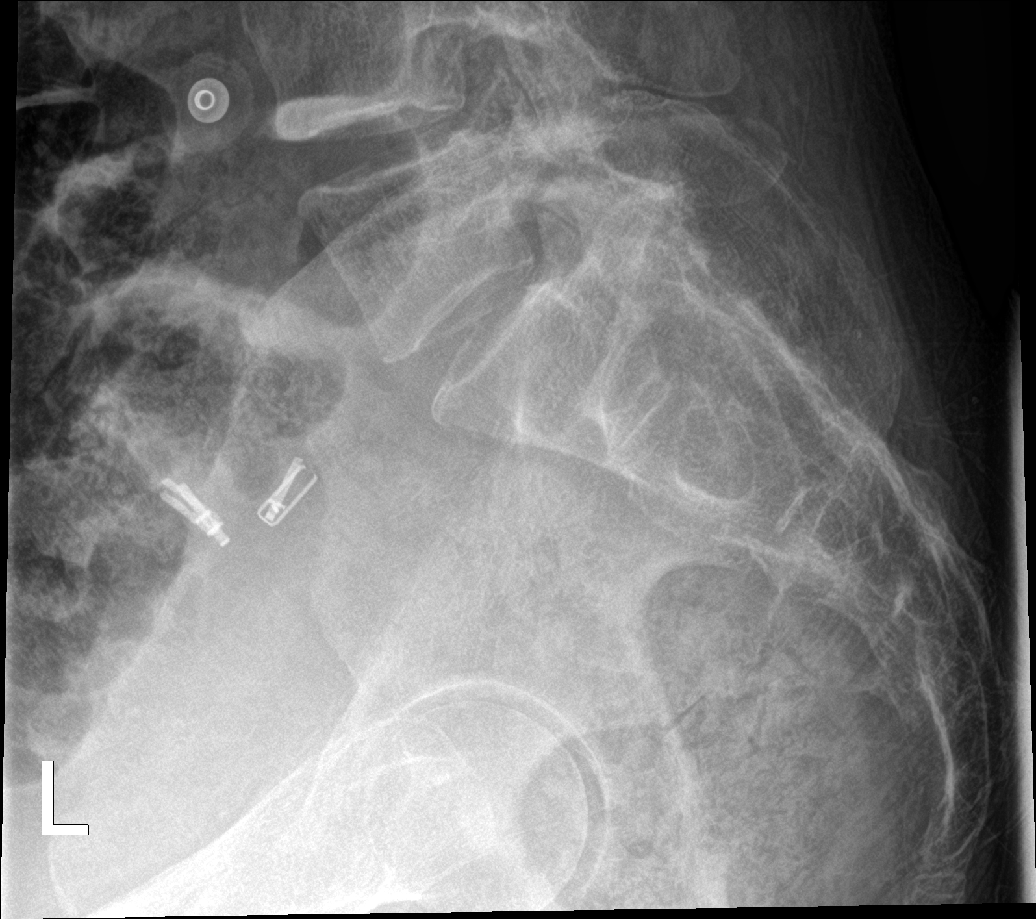

[l-spine obl (3 of 3)]
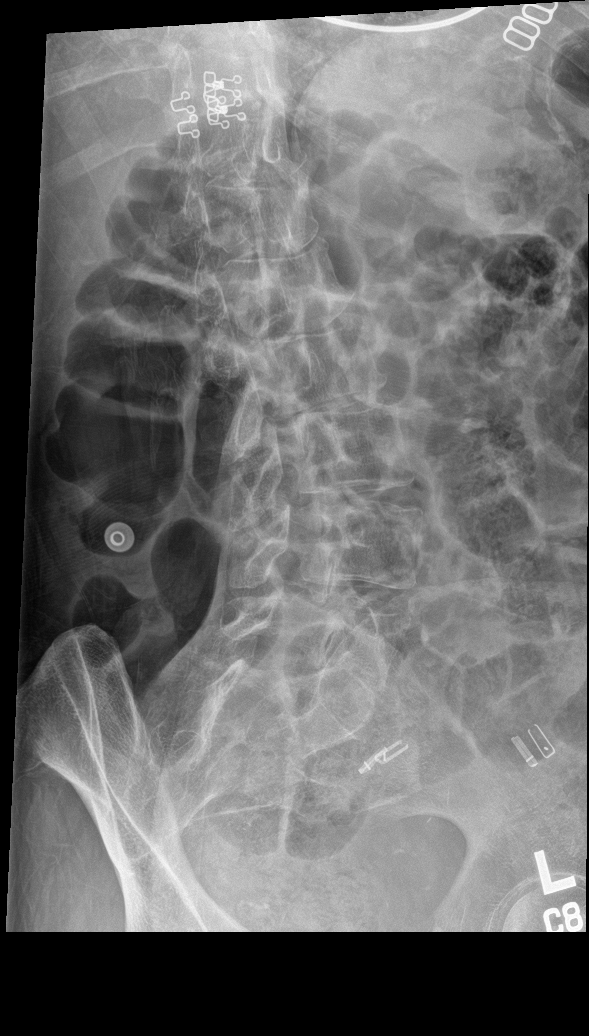

[6 of 6 positions shown; findings below may reference images not displayed]

FINDINGS: Levoscoliosis of the lumbar spine. Possible acute mild superior
endplate deformity at L4. Remaining vertebral body heights are
normal. Moderate degenerative changes at L1-L2 and L2-L3. Bilateral
ligation clips in the pelvis.
IMPRESSION: Possible acute superior endplate deformity at L4, consider CT for
further evaluation.

## 2021-04-10 ENCOUNTER — Other Ambulatory Visit: Payer: Medicare Other

## 2021-04-10 ENCOUNTER — Inpatient Hospital Stay: Admission: RE | Admit: 2021-04-10 | Payer: Medicare Other | Source: Ambulatory Visit

## 2021-04-12 DIAGNOSIS — G40209 Localization-related (focal) (partial) symptomatic epilepsy and epileptic syndromes with complex partial seizures, not intractable, without status epilepticus: Secondary | ICD-10-CM | POA: Diagnosis not present

## 2021-04-12 DIAGNOSIS — R413 Other amnesia: Secondary | ICD-10-CM | POA: Diagnosis not present

## 2021-04-19 DIAGNOSIS — H2512 Age-related nuclear cataract, left eye: Secondary | ICD-10-CM | POA: Diagnosis not present

## 2021-04-19 DIAGNOSIS — H2513 Age-related nuclear cataract, bilateral: Secondary | ICD-10-CM | POA: Diagnosis not present

## 2021-04-19 DIAGNOSIS — H25013 Cortical age-related cataract, bilateral: Secondary | ICD-10-CM | POA: Diagnosis not present

## 2021-04-19 DIAGNOSIS — I10 Essential (primary) hypertension: Secondary | ICD-10-CM | POA: Diagnosis not present

## 2021-04-19 DIAGNOSIS — H25043 Posterior subcapsular polar age-related cataract, bilateral: Secondary | ICD-10-CM | POA: Diagnosis not present

## 2021-05-17 ENCOUNTER — Other Ambulatory Visit: Payer: Self-pay | Admitting: Family Medicine

## 2021-05-17 DIAGNOSIS — E042 Nontoxic multinodular goiter: Secondary | ICD-10-CM

## 2021-06-02 DIAGNOSIS — M9901 Segmental and somatic dysfunction of cervical region: Secondary | ICD-10-CM | POA: Diagnosis not present

## 2021-06-02 DIAGNOSIS — M9904 Segmental and somatic dysfunction of sacral region: Secondary | ICD-10-CM | POA: Diagnosis not present

## 2021-06-02 DIAGNOSIS — M9903 Segmental and somatic dysfunction of lumbar region: Secondary | ICD-10-CM | POA: Diagnosis not present

## 2021-06-02 DIAGNOSIS — M9905 Segmental and somatic dysfunction of pelvic region: Secondary | ICD-10-CM | POA: Diagnosis not present

## 2021-06-03 DIAGNOSIS — U071 COVID-19: Secondary | ICD-10-CM | POA: Diagnosis not present

## 2021-06-04 DIAGNOSIS — U071 COVID-19: Secondary | ICD-10-CM | POA: Diagnosis not present

## 2021-06-04 DIAGNOSIS — J00 Acute nasopharyngitis [common cold]: Secondary | ICD-10-CM | POA: Diagnosis not present

## 2021-06-04 DIAGNOSIS — R059 Cough, unspecified: Secondary | ICD-10-CM | POA: Diagnosis not present

## 2021-06-05 DIAGNOSIS — M9904 Segmental and somatic dysfunction of sacral region: Secondary | ICD-10-CM | POA: Diagnosis not present

## 2021-06-05 DIAGNOSIS — M9905 Segmental and somatic dysfunction of pelvic region: Secondary | ICD-10-CM | POA: Diagnosis not present

## 2021-06-05 DIAGNOSIS — M9901 Segmental and somatic dysfunction of cervical region: Secondary | ICD-10-CM | POA: Diagnosis not present

## 2021-06-05 DIAGNOSIS — M9903 Segmental and somatic dysfunction of lumbar region: Secondary | ICD-10-CM | POA: Diagnosis not present

## 2021-06-22 ENCOUNTER — Ambulatory Visit: Payer: Medicare Other | Admitting: Family Medicine

## 2021-07-17 ENCOUNTER — Ambulatory Visit
Admission: RE | Admit: 2021-07-17 | Discharge: 2021-07-17 | Disposition: A | Discharge status: Home / Self Care | Payer: Medicare Other | Source: Ambulatory Visit | Attending: Family Medicine | Admitting: Family Medicine

## 2021-07-17 DIAGNOSIS — E041 Nontoxic single thyroid nodule: Secondary | ICD-10-CM | POA: Diagnosis not present

## 2021-07-17 DIAGNOSIS — E042 Nontoxic multinodular goiter: Secondary | ICD-10-CM

## 2021-08-16 DIAGNOSIS — R413 Other amnesia: Secondary | ICD-10-CM | POA: Diagnosis not present

## 2021-08-16 DIAGNOSIS — G40209 Localization-related (focal) (partial) symptomatic epilepsy and epileptic syndromes with complex partial seizures, not intractable, without status epilepticus: Secondary | ICD-10-CM | POA: Diagnosis not present

## 2021-08-16 DIAGNOSIS — G3184 Mild cognitive impairment, so stated: Secondary | ICD-10-CM | POA: Diagnosis not present

## 2021-09-01 DIAGNOSIS — F419 Anxiety disorder, unspecified: Secondary | ICD-10-CM | POA: Diagnosis not present

## 2021-09-01 DIAGNOSIS — Z8679 Personal history of other diseases of the circulatory system: Secondary | ICD-10-CM | POA: Diagnosis not present

## 2021-09-01 DIAGNOSIS — Z1211 Encounter for screening for malignant neoplasm of colon: Secondary | ICD-10-CM | POA: Diagnosis not present

## 2021-09-01 DIAGNOSIS — E2839 Other primary ovarian failure: Secondary | ICD-10-CM | POA: Diagnosis not present

## 2021-09-01 DIAGNOSIS — E559 Vitamin D deficiency, unspecified: Secondary | ICD-10-CM | POA: Diagnosis not present

## 2021-09-01 DIAGNOSIS — I10 Essential (primary) hypertension: Secondary | ICD-10-CM | POA: Diagnosis not present

## 2021-09-01 DIAGNOSIS — E7800 Pure hypercholesterolemia, unspecified: Secondary | ICD-10-CM | POA: Diagnosis not present

## 2021-09-01 DIAGNOSIS — Z87898 Personal history of other specified conditions: Secondary | ICD-10-CM | POA: Diagnosis not present

## 2021-09-01 DIAGNOSIS — G4700 Insomnia, unspecified: Secondary | ICD-10-CM | POA: Diagnosis not present

## 2021-09-06 DIAGNOSIS — E7800 Pure hypercholesterolemia, unspecified: Secondary | ICD-10-CM | POA: Diagnosis not present

## 2021-09-06 DIAGNOSIS — E559 Vitamin D deficiency, unspecified: Secondary | ICD-10-CM | POA: Diagnosis not present

## 2021-10-07 ENCOUNTER — Observation Stay (HOSPITAL_COMMUNITY): Payer: Medicare Other

## 2021-10-07 ENCOUNTER — Inpatient Hospital Stay (HOSPITAL_BASED_OUTPATIENT_CLINIC_OR_DEPARTMENT_OTHER)
Admission: EM | Admit: 2021-10-07 | Discharge: 2021-10-10 | DRG: 305 | Disposition: A | Discharge status: Home / Self Care | Payer: Medicare Other | Attending: Family Medicine | Admitting: Family Medicine

## 2021-10-07 ENCOUNTER — Encounter (HOSPITAL_BASED_OUTPATIENT_CLINIC_OR_DEPARTMENT_OTHER): Payer: Self-pay | Admitting: Emergency Medicine

## 2021-10-07 ENCOUNTER — Emergency Department (HOSPITAL_BASED_OUTPATIENT_CLINIC_OR_DEPARTMENT_OTHER): Payer: Medicare Other

## 2021-10-07 ENCOUNTER — Other Ambulatory Visit: Payer: Self-pay

## 2021-10-07 DIAGNOSIS — F1011 Alcohol abuse, in remission: Secondary | ICD-10-CM | POA: Diagnosis present

## 2021-10-07 DIAGNOSIS — Z8249 Family history of ischemic heart disease and other diseases of the circulatory system: Secondary | ICD-10-CM | POA: Diagnosis not present

## 2021-10-07 DIAGNOSIS — E559 Vitamin D deficiency, unspecified: Secondary | ICD-10-CM | POA: Diagnosis present

## 2021-10-07 DIAGNOSIS — E7800 Pure hypercholesterolemia, unspecified: Secondary | ICD-10-CM | POA: Diagnosis not present

## 2021-10-07 DIAGNOSIS — E878 Other disorders of electrolyte and fluid balance, not elsewhere classified: Secondary | ICD-10-CM | POA: Diagnosis present

## 2021-10-07 DIAGNOSIS — Y636 Underdosing and nonadministration of necessary drug, medicament or biological substance: Secondary | ICD-10-CM | POA: Diagnosis present

## 2021-10-07 DIAGNOSIS — M8580 Other specified disorders of bone density and structure, unspecified site: Secondary | ICD-10-CM | POA: Diagnosis not present

## 2021-10-07 DIAGNOSIS — Z888 Allergy status to other drugs, medicaments and biological substances status: Secondary | ICD-10-CM | POA: Diagnosis not present

## 2021-10-07 DIAGNOSIS — I10 Essential (primary) hypertension: Secondary | ICD-10-CM | POA: Diagnosis present

## 2021-10-07 DIAGNOSIS — Z811 Family history of alcohol abuse and dependence: Secondary | ICD-10-CM | POA: Diagnosis not present

## 2021-10-07 DIAGNOSIS — Z8659 Personal history of other mental and behavioral disorders: Secondary | ICD-10-CM | POA: Diagnosis not present

## 2021-10-07 DIAGNOSIS — H9319 Tinnitus, unspecified ear: Secondary | ICD-10-CM | POA: Diagnosis not present

## 2021-10-07 DIAGNOSIS — R42 Dizziness and giddiness: Secondary | ICD-10-CM | POA: Diagnosis not present

## 2021-10-07 DIAGNOSIS — H9190 Unspecified hearing loss, unspecified ear: Secondary | ICD-10-CM | POA: Diagnosis present

## 2021-10-07 DIAGNOSIS — I160 Hypertensive urgency: Secondary | ICD-10-CM | POA: Diagnosis not present

## 2021-10-07 DIAGNOSIS — I6523 Occlusion and stenosis of bilateral carotid arteries: Secondary | ICD-10-CM | POA: Diagnosis not present

## 2021-10-07 DIAGNOSIS — F419 Anxiety disorder, unspecified: Secondary | ICD-10-CM | POA: Diagnosis not present

## 2021-10-07 DIAGNOSIS — R413 Other amnesia: Secondary | ICD-10-CM | POA: Diagnosis present

## 2021-10-07 DIAGNOSIS — Z79899 Other long term (current) drug therapy: Secondary | ICD-10-CM | POA: Diagnosis not present

## 2021-10-07 DIAGNOSIS — Z681 Body mass index (BMI) 19 or less, adult: Secondary | ICD-10-CM | POA: Diagnosis not present

## 2021-10-07 DIAGNOSIS — E871 Hypo-osmolality and hyponatremia: Secondary | ICD-10-CM | POA: Diagnosis not present

## 2021-10-07 DIAGNOSIS — Z885 Allergy status to narcotic agent status: Secondary | ICD-10-CM

## 2021-10-07 DIAGNOSIS — Z20822 Contact with and (suspected) exposure to covid-19: Secondary | ICD-10-CM | POA: Diagnosis not present

## 2021-10-07 DIAGNOSIS — G4700 Insomnia, unspecified: Secondary | ICD-10-CM | POA: Diagnosis not present

## 2021-10-07 DIAGNOSIS — S0003XA Contusion of scalp, initial encounter: Secondary | ICD-10-CM | POA: Diagnosis not present

## 2021-10-07 LAB — CBC WITH DIFFERENTIAL/PLATELET
Abs Immature Granulocytes: 0.02 10*3/uL (ref 0.00–0.07)
Basophils Absolute: 0.1 10*3/uL (ref 0.0–0.1)
Basophils Relative: 1 %
Eosinophils Absolute: 0.1 10*3/uL (ref 0.0–0.5)
Eosinophils Relative: 2 %
HCT: 38.5 % (ref 36.0–46.0)
Hemoglobin: 13.3 g/dL (ref 12.0–15.0)
Immature Granulocytes: 0 %
Lymphocytes Relative: 20 %
Lymphs Abs: 1.1 10*3/uL (ref 0.7–4.0)
MCH: 33.3 pg (ref 26.0–34.0)
MCHC: 34.5 g/dL (ref 30.0–36.0)
MCV: 96.5 fL (ref 80.0–100.0)
Monocytes Absolute: 0.6 10*3/uL (ref 0.1–1.0)
Monocytes Relative: 11 %
Neutro Abs: 3.3 10*3/uL (ref 1.7–7.7)
Neutrophils Relative %: 66 %
Platelets: 218 10*3/uL (ref 150–400)
RBC: 3.99 MIL/uL (ref 3.87–5.11)
RDW: 11.8 % (ref 11.5–15.5)
WBC: 5.1 10*3/uL (ref 4.0–10.5)
nRBC: 0 % (ref 0.0–0.2)

## 2021-10-07 LAB — BASIC METABOLIC PANEL
Anion gap: 11 (ref 5–15)
BUN: 14 mg/dL (ref 8–23)
CO2: 26 mmol/L (ref 22–32)
Calcium: 9.3 mg/dL (ref 8.9–10.3)
Chloride: 89 mmol/L — ABNORMAL LOW (ref 98–111)
Creatinine, Ser: 0.65 mg/dL (ref 0.44–1.00)
GFR, Estimated: >60 mL/min (ref >60)
Glucose, Bld: 122 mg/dL — ABNORMAL HIGH (ref 70–99)
Potassium: 4.6 mmol/L (ref 3.5–5.1)
Sodium: 126 mmol/L — ABNORMAL LOW (ref 135–145)

## 2021-10-07 LAB — OSMOLALITY, URINE: Osmolality, Ur: 426 mOsm/kg (ref 300–900)

## 2021-10-07 LAB — RESP PANEL BY RT-PCR (FLU A&B, COVID) ARPGX2
Influenza A by PCR: NEGATIVE
Influenza B by PCR: NEGATIVE
SARS Coronavirus 2 by RT PCR: NEGATIVE

## 2021-10-07 LAB — ETHANOL: Alcohol, Ethyl (B): <10 mg/dL (ref <10)

## 2021-10-07 LAB — OSMOLALITY: Osmolality: 276 mOsm/kg (ref 275–295)

## 2021-10-07 LAB — HEPATIC FUNCTION PANEL
ALT: 15 U/L (ref 0–44)
AST: 27 U/L (ref 15–41)
Albumin: 4.4 g/dL (ref 3.5–5.0)
Alkaline Phosphatase: 63 U/L (ref 38–126)
Bilirubin, Direct: 0.1 mg/dL (ref 0.0–0.2)
Indirect Bilirubin: 0.5 mg/dL (ref 0.3–0.9)
Total Bilirubin: 0.6 mg/dL (ref 0.3–1.2)
Total Protein: 7.5 g/dL (ref 6.5–8.1)

## 2021-10-07 LAB — SODIUM, URINE, RANDOM: Sodium, Ur: 106 mmol/L

## 2021-10-07 LAB — TSH: TSH: 3.581 u[IU]/mL (ref 0.350–4.500)

## 2021-10-07 MED ORDER — MEMANTINE HCL 10 MG PO TABS
10.0000 mg | ORAL_TABLET | Freq: Two times a day (BID) | ORAL | Status: DC
  Administered 2021-10-07 – 2021-10-10 (×6): 10 mg via ORAL
  Filled 2021-10-07 (×6): qty 1

## 2021-10-07 MED ORDER — SODIUM CHLORIDE 0.9 % IV SOLN: 250.0000 mL | INTRAVENOUS | Status: DC | PRN

## 2021-10-07 MED ORDER — ACETAMINOPHEN 325 MG PO TABS
650.0000 mg | ORAL_TABLET | Freq: Four times a day (QID) | ORAL | Status: AC | PRN
  Administered 2021-10-07 – 2021-10-08 (×3): 650 mg via ORAL
  Filled 2021-10-07 (×3): qty 2

## 2021-10-07 MED ORDER — AMLODIPINE BESYLATE 5 MG PO TABS
2.5000 mg | ORAL_TABLET | Freq: Every day | ORAL | Status: DC
  Administered 2021-10-07 – 2021-10-10 (×4): 2.5 mg via ORAL
  Filled 2021-10-07 (×4): qty 1

## 2021-10-07 MED ORDER — ENOXAPARIN SODIUM 40 MG/0.4ML IJ SOSY
40.0000 mg | PREFILLED_SYRINGE | INTRAMUSCULAR | Status: DC
  Administered 2021-10-07 – 2021-10-09 (×3): 40 mg via SUBCUTANEOUS
  Filled 2021-10-07 (×3): qty 0.4

## 2021-10-07 MED ORDER — METOPROLOL TARTRATE 5 MG/5ML IV SOLN
2.5000 mg | Freq: Once | INTRAVENOUS | Status: AC
  Administered 2021-10-07: 2.5 mg via INTRAVENOUS
  Filled 2021-10-07: qty 5

## 2021-10-07 MED ORDER — SODIUM CHLORIDE 0.9% FLUSH
3.0000 mL | Freq: Two times a day (BID) | INTRAVENOUS | Status: DC
  Administered 2021-10-07 – 2021-10-10 (×4): 3 mL via INTRAVENOUS

## 2021-10-07 MED ORDER — IOHEXOL 350 MG/ML SOLN
100.0000 mL | Freq: Once | INTRAVENOUS | Status: AC | PRN
  Administered 2021-10-07: 75 mL via INTRAVENOUS

## 2021-10-07 MED ORDER — CLONAZEPAM 0.25 MG PO TBDP
0.2500 mg | ORAL_TABLET | Freq: Two times a day (BID) | ORAL | Status: DC
  Administered 2021-10-07 – 2021-10-10 (×6): 0.25 mg via ORAL
  Filled 2021-10-07 (×6): qty 1

## 2021-10-07 MED ORDER — B COMPLEX PO TABS: 1.0000 | ORAL_TABLET | Freq: Every day | ORAL | Status: DC

## 2021-10-07 MED ORDER — HYDRALAZINE HCL 20 MG/ML IJ SOLN: 5.0000 mg | Freq: Four times a day (QID) | INTRAMUSCULAR | Status: DC | PRN

## 2021-10-07 MED ORDER — VITAMIN D 25 MCG (1000 UNIT) PO TABS
5000.0000 [IU] | ORAL_TABLET | Freq: Every day | ORAL | Status: DC
  Administered 2021-10-08 – 2021-10-10 (×3): 5000 [IU] via ORAL
  Filled 2021-10-07 (×3): qty 5

## 2021-10-07 MED ORDER — AMLODIPINE BESYLATE 5 MG PO TABS: 2.5000 mg | ORAL_TABLET | Freq: Every day | ORAL | Status: DC

## 2021-10-07 MED ORDER — SODIUM CHLORIDE 0.9% FLUSH: 3.0000 mL | INTRAVENOUS | Status: DC | PRN

## 2021-10-07 MED ORDER — ADULT MULTIVITAMIN W/MINERALS CH
1.0000 | ORAL_TABLET | Freq: Every day | ORAL | Status: DC
  Administered 2021-10-08 – 2021-10-10 (×3): 1 via ORAL
  Filled 2021-10-07 (×3): qty 1

## 2021-10-07 MED ORDER — LAMOTRIGINE 100 MG PO TABS
100.0000 mg | ORAL_TABLET | Freq: Every day | ORAL | Status: DC
  Administered 2021-10-07 – 2021-10-10 (×4): 100 mg via ORAL
  Filled 2021-10-07 (×4): qty 1

## 2021-10-07 MED ORDER — SENNOSIDES-DOCUSATE SODIUM 8.6-50 MG PO TABS: 1.0000 | ORAL_TABLET | Freq: Every evening | ORAL | Status: DC | PRN

## 2021-10-07 MED ORDER — ADULT MULTIVITAMIN W/MINERALS CH: 2.0000 | ORAL_TABLET | Freq: Every day | ORAL | Status: DC

## 2021-10-07 MED ORDER — TRANDOLAPRIL 1 MG PO TABS
4.0000 mg | ORAL_TABLET | Freq: Every day | ORAL | Status: DC
  Administered 2021-10-08 – 2021-10-10 (×3): 4 mg via ORAL
  Filled 2021-10-07 (×3): qty 4

## 2021-10-07 MED ORDER — HYDROXYZINE HCL 25 MG PO TABS
25.0000 mg | ORAL_TABLET | Freq: Three times a day (TID) | ORAL | Status: DC | PRN
  Administered 2021-10-07: 25 mg via ORAL
  Filled 2021-10-07 (×2): qty 1

## 2021-10-07 MED ORDER — HYDRALAZINE HCL 25 MG PO TABS
25.0000 mg | ORAL_TABLET | Freq: Once | ORAL | Status: AC | PRN
  Administered 2021-10-07: 25 mg via ORAL
  Filled 2021-10-07: qty 1

## 2021-10-07 MED ORDER — TRAZODONE HCL 50 MG PO TABS
150.0000 mg | ORAL_TABLET | Freq: Every day | ORAL | Status: DC
  Administered 2021-10-07 – 2021-10-09 (×3): 150 mg via ORAL
  Filled 2021-10-07 (×3): qty 1

## 2021-10-07 MED ORDER — ONDANSETRON HCL 4 MG/2ML IJ SOLN
4.0000 mg | Freq: Three times a day (TID) | INTRAMUSCULAR | Status: DC | PRN
  Administered 2021-10-07: 4 mg via INTRAVENOUS
  Filled 2021-10-07: qty 2
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
## 2021-10-07 NOTE — ED Notes (Signed)
[facility] called for transport to 5M22 @ [facility]

## 2021-10-07 NOTE — ED Notes (Addendum)
Assumed care from [facility]. Patient laying quietly on gurney. Patient given snacks at this time - ok per MD - MD made aware of persistent hypertension. MD noted PRN medication for BP over 200 systolic. No acute distress noted. Waiting for a ready admission bed at this time. Patient updated on plan of care. Will continue to monitor. No neuro deficits noted at this time.  1423: Patient was complaining of a slight HA - MD made aware - Patient medicated per orders. Lights dimmed for comfort. Will continue to monitor.  1515: Patient ambulated to and from restroom without incident with this RN at her side for support.   1547: Report given to Estate manager/land agent at [facility] Room [phone] via secure chat. [facility] called - Patient calm/cooperative at this time, updated on plan of care.  1616: [facility] at bedside at this time to transport patient. No acute distress or neuro deficits noted at this RN's departure of Patient.

## 2021-10-07 NOTE — ED Notes (Signed)
Pt reports symptom of "unsteadiness" since Thursday, along with headache starting Friday.  Pt awoke last night with headache and lightheadedness and took 1 Movic and 2 tylenol.  Pt states symptoms continued and so she took a 2nd Movic this morning.  Pt states dizziness is worse with movement, better with laying still.

## 2021-10-07 NOTE — ED Notes (Signed)
Pt reports she is unable to provide urine sample at this time. °

## 2021-10-07 NOTE — ED Triage Notes (Signed)
Pt reports BP of 194/117 at home along with mild lightedheadness.  Denies nausea, vision changes or other symptoms. Pt took 2nd Mavic this morning before taking BP b/c she felt her BP was too high based on how she felt.

## 2021-10-07 NOTE — ED Notes (Signed)
Dr Nanavati in room w/pt now. 

## 2021-10-07 NOTE — Progress Notes (Signed)
Patient gave pharmacy representative home medications.

## 2021-10-08 DIAGNOSIS — I160 Hypertensive urgency: Secondary | ICD-10-CM | POA: Diagnosis not present

## 2021-10-08 LAB — BASIC METABOLIC PANEL
Anion gap: 9 (ref 5–15)
BUN: 8 mg/dL (ref 8–23)
CO2: 24 mmol/L (ref 22–32)
Calcium: 8.4 mg/dL — ABNORMAL LOW (ref 8.9–10.3)
Chloride: 84 mmol/L — ABNORMAL LOW (ref 98–111)
Creatinine, Ser: 0.72 mg/dL (ref 0.44–1.00)
GFR, Estimated: >60 mL/min (ref >60)
Glucose, Bld: 106 mg/dL — ABNORMAL HIGH (ref 70–99)
Potassium: 3.9 mmol/L (ref 3.5–5.1)
Sodium: 117 mmol/L — CL (ref 135–145)

## 2021-10-08 LAB — SODIUM: Sodium: 117 mmol/L — CL (ref 135–145)

## 2021-10-08 LAB — CORTISOL-AM, BLOOD: Cortisol - AM: 9.8 ug/dL (ref 6.7–22.6)

## 2021-10-08 MED ORDER — SODIUM CHLORIDE 0.9 % IV SOLN: INTRAVENOUS | Status: DC

## 2021-10-08 MED ORDER — SODIUM CHLORIDE 1 G PO TABS
1.0000 g | ORAL_TABLET | Freq: Three times a day (TID) | ORAL | Status: DC
  Administered 2021-10-08 – 2021-10-10 (×5): 1 g via ORAL
  Filled 2021-10-08 (×5): qty 1

## 2021-10-08 MED ORDER — SODIUM CHLORIDE 1 G PO TABS: 1.0000 g | ORAL_TABLET | Freq: Two times a day (BID) | ORAL | Status: DC

## 2021-10-08 NOTE — Progress Notes (Signed)
NEW ADMISSION NOTE New Admission Note:   Arrival Method:  Stretcher Mental Orientation:  A&O x4 Telemetry: box 20 Assessment: Completed Skin: Intact IV: Right AC Pain:  0 out of 10 Tubes: none  Safety Measures: Safety Fall Prevention Plan has been given, discussed and signed Admission: Completed [facility] Orientation: Patient has been orientated to the room, unit and staff.  Family: currently bedside  Orders have been reviewed and implemented. Will continue to monitor the patient. Call light has been placed within reach and bed alarm has been activated.   Velia Meyer, RN

## 2021-10-08 NOTE — Care Management Obs Status (Signed)
MEDICARE OBSERVATION STATUS NOTIFICATION   Patient Details  Name: Kathleen Robles MRN: [id number] Date of Birth: 1946-10-21   Medicare Observation Status Notification Given:  Yes    Bess Kinds, RN 10/08/2021, 3:49 PM
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
## 2021-10-09 DIAGNOSIS — H9319 Tinnitus, unspecified ear: Secondary | ICD-10-CM | POA: Diagnosis present

## 2021-10-09 DIAGNOSIS — E559 Vitamin D deficiency, unspecified: Secondary | ICD-10-CM | POA: Diagnosis present

## 2021-10-09 DIAGNOSIS — Z8659 Personal history of other mental and behavioral disorders: Secondary | ICD-10-CM | POA: Diagnosis not present

## 2021-10-09 DIAGNOSIS — E7800 Pure hypercholesterolemia, unspecified: Secondary | ICD-10-CM | POA: Diagnosis present

## 2021-10-09 DIAGNOSIS — Z8249 Family history of ischemic heart disease and other diseases of the circulatory system: Secondary | ICD-10-CM | POA: Diagnosis not present

## 2021-10-09 DIAGNOSIS — Z811 Family history of alcohol abuse and dependence: Secondary | ICD-10-CM | POA: Diagnosis not present

## 2021-10-09 DIAGNOSIS — Z885 Allergy status to narcotic agent status: Secondary | ICD-10-CM | POA: Diagnosis not present

## 2021-10-09 DIAGNOSIS — M8580 Other specified disorders of bone density and structure, unspecified site: Secondary | ICD-10-CM | POA: Diagnosis present

## 2021-10-09 DIAGNOSIS — E871 Hypo-osmolality and hyponatremia: Secondary | ICD-10-CM | POA: Diagnosis present

## 2021-10-09 DIAGNOSIS — Z79899 Other long term (current) drug therapy: Secondary | ICD-10-CM | POA: Diagnosis not present

## 2021-10-09 DIAGNOSIS — Z888 Allergy status to other drugs, medicaments and biological substances status: Secondary | ICD-10-CM | POA: Diagnosis not present

## 2021-10-09 DIAGNOSIS — I160 Hypertensive urgency: Secondary | ICD-10-CM | POA: Diagnosis present

## 2021-10-09 DIAGNOSIS — Z681 Body mass index (BMI) 19 or less, adult: Secondary | ICD-10-CM | POA: Diagnosis not present

## 2021-10-09 DIAGNOSIS — Y636 Underdosing and nonadministration of necessary drug, medicament or biological substance: Secondary | ICD-10-CM | POA: Diagnosis present

## 2021-10-09 DIAGNOSIS — F419 Anxiety disorder, unspecified: Secondary | ICD-10-CM | POA: Diagnosis present

## 2021-10-09 DIAGNOSIS — F1011 Alcohol abuse, in remission: Secondary | ICD-10-CM | POA: Diagnosis present

## 2021-10-09 DIAGNOSIS — I10 Essential (primary) hypertension: Secondary | ICD-10-CM | POA: Diagnosis present

## 2021-10-09 DIAGNOSIS — H9190 Unspecified hearing loss, unspecified ear: Secondary | ICD-10-CM | POA: Diagnosis present

## 2021-10-09 DIAGNOSIS — Z20822 Contact with and (suspected) exposure to covid-19: Secondary | ICD-10-CM | POA: Diagnosis present

## 2021-10-09 DIAGNOSIS — G4700 Insomnia, unspecified: Secondary | ICD-10-CM | POA: Diagnosis present

## 2021-10-09 DIAGNOSIS — E878 Other disorders of electrolyte and fluid balance, not elsewhere classified: Secondary | ICD-10-CM | POA: Diagnosis present

## 2021-10-09 LAB — BASIC METABOLIC PANEL
Anion gap: 9 (ref 5–15)
BUN: 13 mg/dL (ref 8–23)
CO2: 26 mmol/L (ref 22–32)
Calcium: 8.7 mg/dL — ABNORMAL LOW (ref 8.9–10.3)
Chloride: 89 mmol/L — ABNORMAL LOW (ref 98–111)
Creatinine, Ser: 0.8 mg/dL (ref 0.44–1.00)
GFR, Estimated: >60 mL/min (ref >60)
Glucose, Bld: 96 mg/dL (ref 70–99)
Potassium: 4.1 mmol/L (ref 3.5–5.1)
Sodium: 124 mmol/L — ABNORMAL LOW (ref 135–145)

## 2021-10-09 LAB — CBC
HCT: 40.3 % (ref 36.0–46.0)
Hemoglobin: 13.7 g/dL (ref 12.0–15.0)
MCH: 33.2 pg (ref 26.0–34.0)
MCHC: 34 g/dL (ref 30.0–36.0)
MCV: 97.6 fL (ref 80.0–100.0)
Platelets: 226 10*3/uL (ref 150–400)
RBC: 4.13 MIL/uL (ref 3.87–5.11)
RDW: 11.8 % (ref 11.5–15.5)
WBC: 4.6 10*3/uL (ref 4.0–10.5)
nRBC: 0 % (ref 0.0–0.2)

## 2021-10-09 LAB — MAGNESIUM: Magnesium: 2 mg/dL (ref 1.7–2.4)

## 2021-10-09 LAB — PHOSPHORUS: Phosphorus: 4.2 mg/dL (ref 2.5–4.6)

## 2021-10-09 MED ORDER — ACETAMINOPHEN 325 MG PO TABS
650.0000 mg | ORAL_TABLET | Freq: Four times a day (QID) | ORAL | Status: DC | PRN
  Administered 2021-10-09 (×2): 650 mg via ORAL
  Filled 2021-10-09: qty 2
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
## 2021-10-10 DIAGNOSIS — I160 Hypertensive urgency: Secondary | ICD-10-CM | POA: Diagnosis not present

## 2021-10-10 LAB — BASIC METABOLIC PANEL
Anion gap: 7 (ref 5–15)
BUN: 9 mg/dL (ref 8–23)
CO2: 25 mmol/L (ref 22–32)
Calcium: 8.7 mg/dL — ABNORMAL LOW (ref 8.9–10.3)
Chloride: 99 mmol/L (ref 98–111)
Creatinine, Ser: 0.68 mg/dL (ref 0.44–1.00)
GFR, Estimated: >60 mL/min (ref >60)
Glucose, Bld: 94 mg/dL (ref 70–99)
Potassium: 4.4 mmol/L (ref 3.5–5.1)
Sodium: 131 mmol/L — ABNORMAL LOW (ref 135–145)

## 2021-10-10 MED ORDER — AMLODIPINE BESYLATE 2.5 MG PO TABS: 2.5000 mg | ORAL_TABLET | Freq: Every day | ORAL | 1 refills | Status: AC

## 2021-10-10 NOTE — Progress Notes (Signed)
[name] to be discharged Home per MD order. Discussed prescriptions and follow up appointments with the patient. Prescriptions and medication list explained in detail. Patient verbalized understanding.  Skin clean, dry and intact without evidence of skin break down, no evidence of skin tears noted. IV catheter discontinued intact. Site without signs and symptoms of complications. Dressing and pressure applied. Pt denies pain at the site currently. No complaints noted.  Patient free of lines, drains, and wounds.   An After Visit Summary (AVS) was printed and given to the patient. Patient escorted via wheelchair, and discharged home via private auto.  Arvilla Meres, RN
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
## 2021-10-10 NOTE — Discharge Instructions (Signed)
Advised to follow up PCP in one week. Advised to take regular diet.

## 2021-10-25 DIAGNOSIS — Z1211 Encounter for screening for malignant neoplasm of colon: Secondary | ICD-10-CM | POA: Diagnosis not present

## 2021-10-27 DIAGNOSIS — E871 Hypo-osmolality and hyponatremia: Secondary | ICD-10-CM | POA: Diagnosis not present

## 2021-11-22 DIAGNOSIS — G40209 Localization-related (focal) (partial) symptomatic epilepsy and epileptic syndromes with complex partial seizures, not intractable, without status epilepticus: Secondary | ICD-10-CM | POA: Diagnosis not present

## 2021-11-22 DIAGNOSIS — G3184 Mild cognitive impairment, so stated: Secondary | ICD-10-CM | POA: Diagnosis not present

## 2021-11-22 DIAGNOSIS — R413 Other amnesia: Secondary | ICD-10-CM | POA: Diagnosis not present

## 2021-11-22 DIAGNOSIS — Z79899 Other long term (current) drug therapy: Secondary | ICD-10-CM | POA: Diagnosis not present

## 2021-11-23 DIAGNOSIS — I10 Essential (primary) hypertension: Secondary | ICD-10-CM | POA: Diagnosis not present

## 2021-11-23 DIAGNOSIS — R5381 Other malaise: Secondary | ICD-10-CM | POA: Diagnosis not present

## 2021-11-23 DIAGNOSIS — M8900 Algoneurodystrophy, unspecified site: Secondary | ICD-10-CM | POA: Diagnosis not present

## 2021-11-23 DIAGNOSIS — N959 Unspecified menopausal and perimenopausal disorder: Secondary | ICD-10-CM | POA: Diagnosis not present

## 2021-11-23 DIAGNOSIS — E042 Nontoxic multinodular goiter: Secondary | ICD-10-CM | POA: Diagnosis not present

## 2021-11-23 DIAGNOSIS — R55 Syncope and collapse: Secondary | ICD-10-CM | POA: Diagnosis not present

## 2021-11-27 DIAGNOSIS — R413 Other amnesia: Secondary | ICD-10-CM | POA: Diagnosis not present

## 2021-11-27 DIAGNOSIS — R195 Other fecal abnormalities: Secondary | ICD-10-CM | POA: Diagnosis not present

## 2021-11-27 DIAGNOSIS — R569 Unspecified convulsions: Secondary | ICD-10-CM | POA: Diagnosis not present

## 2021-11-27 DIAGNOSIS — G4700 Insomnia, unspecified: Secondary | ICD-10-CM | POA: Diagnosis not present

## 2021-11-27 DIAGNOSIS — E871 Hypo-osmolality and hyponatremia: Secondary | ICD-10-CM | POA: Diagnosis not present

## 2021-11-27 DIAGNOSIS — Z8781 Personal history of (healed) traumatic fracture: Secondary | ICD-10-CM | POA: Diagnosis not present

## 2021-11-27 DIAGNOSIS — I10 Essential (primary) hypertension: Secondary | ICD-10-CM | POA: Diagnosis not present

## 2021-11-29 DIAGNOSIS — E539 Vitamin B deficiency, unspecified: Secondary | ICD-10-CM | POA: Diagnosis not present

## 2021-11-29 DIAGNOSIS — D509 Iron deficiency anemia, unspecified: Secondary | ICD-10-CM | POA: Diagnosis not present

## 2021-11-29 DIAGNOSIS — D649 Anemia, unspecified: Secondary | ICD-10-CM | POA: Diagnosis not present

## 2021-11-29 DIAGNOSIS — R7989 Other specified abnormal findings of blood chemistry: Secondary | ICD-10-CM | POA: Diagnosis not present

## 2021-12-04 ENCOUNTER — Other Ambulatory Visit: Payer: Self-pay | Admitting: Family Medicine

## 2021-12-04 DIAGNOSIS — M8588 Other specified disorders of bone density and structure, other site: Secondary | ICD-10-CM

## 2021-12-04 DIAGNOSIS — Z13820 Encounter for screening for osteoporosis: Secondary | ICD-10-CM

## 2021-12-05 ENCOUNTER — Other Ambulatory Visit: Payer: Self-pay | Admitting: Family Medicine

## 2021-12-05 DIAGNOSIS — Z1239 Encounter for other screening for malignant neoplasm of breast: Secondary | ICD-10-CM

## 2022-01-17 DIAGNOSIS — H2513 Age-related nuclear cataract, bilateral: Secondary | ICD-10-CM | POA: Diagnosis not present

## 2022-01-17 DIAGNOSIS — I10 Essential (primary) hypertension: Secondary | ICD-10-CM | POA: Diagnosis not present

## 2022-01-17 DIAGNOSIS — H2512 Age-related nuclear cataract, left eye: Secondary | ICD-10-CM | POA: Diagnosis not present

## 2022-01-17 DIAGNOSIS — H25043 Posterior subcapsular polar age-related cataract, bilateral: Secondary | ICD-10-CM | POA: Diagnosis not present

## 2022-01-17 DIAGNOSIS — H25013 Cortical age-related cataract, bilateral: Secondary | ICD-10-CM | POA: Diagnosis not present

## 2022-01-19 DIAGNOSIS — Z1231 Encounter for screening mammogram for malignant neoplasm of breast: Secondary | ICD-10-CM | POA: Diagnosis not present

## 2022-01-19 DIAGNOSIS — Z20822 Contact with and (suspected) exposure to covid-19: Secondary | ICD-10-CM | POA: Diagnosis not present

## 2022-01-29 DIAGNOSIS — I10 Essential (primary) hypertension: Secondary | ICD-10-CM | POA: Diagnosis not present

## 2022-01-29 DIAGNOSIS — R195 Other fecal abnormalities: Secondary | ICD-10-CM | POA: Diagnosis not present

## 2022-02-20 DIAGNOSIS — H2512 Age-related nuclear cataract, left eye: Secondary | ICD-10-CM | POA: Diagnosis not present

## 2022-02-20 DIAGNOSIS — H2511 Age-related nuclear cataract, right eye: Secondary | ICD-10-CM | POA: Diagnosis not present

## 2022-02-27 DIAGNOSIS — H2511 Age-related nuclear cataract, right eye: Secondary | ICD-10-CM | POA: Diagnosis not present

## 2022-02-28 DIAGNOSIS — R42 Dizziness and giddiness: Secondary | ICD-10-CM | POA: Diagnosis not present

## 2022-02-28 DIAGNOSIS — R55 Syncope and collapse: Secondary | ICD-10-CM | POA: Diagnosis not present

## 2022-02-28 DIAGNOSIS — R4020 Unspecified coma: Secondary | ICD-10-CM | POA: Diagnosis not present

## 2022-03-14 DIAGNOSIS — R413 Other amnesia: Secondary | ICD-10-CM | POA: Diagnosis not present

## 2022-03-14 DIAGNOSIS — Z79899 Other long term (current) drug therapy: Secondary | ICD-10-CM | POA: Diagnosis not present

## 2022-03-14 DIAGNOSIS — G40209 Localization-related (focal) (partial) symptomatic epilepsy and epileptic syndromes with complex partial seizures, not intractable, without status epilepticus: Secondary | ICD-10-CM | POA: Diagnosis not present

## 2022-03-14 DIAGNOSIS — G3184 Mild cognitive impairment, so stated: Secondary | ICD-10-CM | POA: Diagnosis not present

## 2022-03-29 ENCOUNTER — Other Ambulatory Visit: Payer: Self-pay | Admitting: Family Medicine

## 2022-03-29 DIAGNOSIS — E049 Nontoxic goiter, unspecified: Secondary | ICD-10-CM

## 2022-04-16 ENCOUNTER — Ambulatory Visit
Admission: RE | Admit: 2022-04-16 | Discharge: 2022-04-16 | Disposition: A | Discharge status: Home / Self Care | Payer: Medicare Other | Source: Ambulatory Visit | Attending: Family Medicine | Admitting: Family Medicine

## 2022-04-16 DIAGNOSIS — E049 Nontoxic goiter, unspecified: Secondary | ICD-10-CM | POA: Diagnosis not present

## 2022-05-07 ENCOUNTER — Other Ambulatory Visit: Payer: Medicare Other

## 2022-05-07 ENCOUNTER — Ambulatory Visit: Payer: Medicare Other

## 2022-05-16 ENCOUNTER — Other Ambulatory Visit: Payer: Self-pay | Admitting: *Deleted

## 2022-05-16 ENCOUNTER — Other Ambulatory Visit: Payer: Self-pay

## 2022-05-16 ENCOUNTER — Ambulatory Visit
Admission: RE | Admit: 2022-05-16 | Discharge: 2022-05-16 | Disposition: A | Discharge status: Home / Self Care | Payer: Medicare Other | Source: Ambulatory Visit | Attending: *Deleted | Admitting: *Deleted

## 2022-05-16 DIAGNOSIS — R0781 Pleurodynia: Secondary | ICD-10-CM | POA: Diagnosis not present

## 2022-05-16 DIAGNOSIS — R0789 Other chest pain: Secondary | ICD-10-CM

## 2022-05-25 ENCOUNTER — Ambulatory Visit
Admission: RE | Admit: 2022-05-25 | Discharge: 2022-05-25 | Disposition: A | Discharge status: Home / Self Care | Payer: Medicare Other | Source: Ambulatory Visit | Attending: Family Medicine | Admitting: Family Medicine

## 2022-05-25 DIAGNOSIS — Z13820 Encounter for screening for osteoporosis: Secondary | ICD-10-CM

## 2022-05-25 DIAGNOSIS — M810 Age-related osteoporosis without current pathological fracture: Secondary | ICD-10-CM | POA: Diagnosis not present

## 2022-05-25 DIAGNOSIS — M8588 Other specified disorders of bone density and structure, other site: Secondary | ICD-10-CM

## 2022-05-25 DIAGNOSIS — Z780 Asymptomatic menopausal state: Secondary | ICD-10-CM | POA: Diagnosis not present

## 2022-08-15 DIAGNOSIS — G40209 Localization-related (focal) (partial) symptomatic epilepsy and epileptic syndromes with complex partial seizures, not intractable, without status epilepticus: Secondary | ICD-10-CM | POA: Diagnosis not present

## 2022-10-08 DIAGNOSIS — M810 Age-related osteoporosis without current pathological fracture: Secondary | ICD-10-CM | POA: Diagnosis not present

## 2022-10-08 DIAGNOSIS — K649 Unspecified hemorrhoids: Secondary | ICD-10-CM | POA: Diagnosis not present

## 2022-10-08 DIAGNOSIS — Z0000 Encounter for general adult medical examination without abnormal findings: Secondary | ICD-10-CM | POA: Diagnosis not present

## 2022-12-11 DIAGNOSIS — H5211 Myopia, right eye: Secondary | ICD-10-CM | POA: Diagnosis not present

## 2022-12-11 DIAGNOSIS — Z9842 Cataract extraction status, left eye: Secondary | ICD-10-CM | POA: Diagnosis not present

## 2022-12-11 DIAGNOSIS — Z961 Presence of intraocular lens: Secondary | ICD-10-CM | POA: Diagnosis not present

## 2022-12-11 DIAGNOSIS — H52223 Regular astigmatism, bilateral: Secondary | ICD-10-CM | POA: Diagnosis not present

## 2022-12-11 DIAGNOSIS — H26499 Other secondary cataract, unspecified eye: Secondary | ICD-10-CM | POA: Diagnosis not present

## 2022-12-11 DIAGNOSIS — H524 Presbyopia: Secondary | ICD-10-CM | POA: Diagnosis not present

## 2022-12-11 DIAGNOSIS — Z9841 Cataract extraction status, right eye: Secondary | ICD-10-CM | POA: Diagnosis not present

## 2023-05-24 ENCOUNTER — Encounter (HOSPITAL_BASED_OUTPATIENT_CLINIC_OR_DEPARTMENT_OTHER): Payer: Self-pay | Admitting: Emergency Medicine

## 2023-05-24 ENCOUNTER — Emergency Department (HOSPITAL_BASED_OUTPATIENT_CLINIC_OR_DEPARTMENT_OTHER)
Admission: EM | Admit: 2023-05-24 | Discharge: 2023-05-24 | Disposition: A | Discharge status: Home / Self Care | Payer: Medicare HMO | Attending: Emergency Medicine | Admitting: Emergency Medicine

## 2023-05-24 DIAGNOSIS — E871 Hypo-osmolality and hyponatremia: Secondary | ICD-10-CM | POA: Diagnosis present

## 2023-05-24 DIAGNOSIS — Z79899 Other long term (current) drug therapy: Secondary | ICD-10-CM | POA: Diagnosis not present

## 2023-05-24 DIAGNOSIS — Z7982 Long term (current) use of aspirin: Secondary | ICD-10-CM | POA: Insufficient documentation

## 2023-05-24 DIAGNOSIS — I10 Essential (primary) hypertension: Secondary | ICD-10-CM | POA: Insufficient documentation

## 2023-05-24 LAB — URINALYSIS, ROUTINE W REFLEX MICROSCOPIC
Bilirubin Urine: NEGATIVE
Glucose, UA: NEGATIVE mg/dL
Hgb urine dipstick: NEGATIVE
Ketones, ur: NEGATIVE mg/dL
Leukocytes,Ua: NEGATIVE
Nitrite: NEGATIVE
Protein, ur: NEGATIVE mg/dL
Specific Gravity, Urine: 1.009 (ref 1.005–1.030)
pH: 6.5 (ref 5.0–8.0)

## 2023-05-24 LAB — COMPREHENSIVE METABOLIC PANEL
ALT: 20 U/L (ref 0–44)
AST: 27 U/L (ref 15–41)
Albumin: 4.3 g/dL (ref 3.5–5.0)
Alkaline Phosphatase: 65 U/L (ref 38–126)
Anion gap: 7 (ref 5–15)
BUN: 12 mg/dL (ref 8–23)
CO2: 27 mmol/L (ref 22–32)
Calcium: 9 mg/dL (ref 8.9–10.3)
Chloride: 93 mmol/L — ABNORMAL LOW (ref 98–111)
Creatinine, Ser: 0.75 mg/dL (ref 0.44–1.00)
GFR, Estimated: >60 mL/min (ref >60)
Glucose, Bld: 146 mg/dL — ABNORMAL HIGH (ref 70–99)
Potassium: 4 mmol/L (ref 3.5–5.1)
Sodium: 127 mmol/L — ABNORMAL LOW (ref 135–145)
Total Bilirubin: 0.3 mg/dL (ref 0.3–1.2)
Total Protein: 7.1 g/dL (ref 6.5–8.1)

## 2023-05-24 LAB — CBC
HCT: 35.2 % — ABNORMAL LOW (ref 36.0–46.0)
Hemoglobin: 12 g/dL (ref 12.0–15.0)
MCH: 33.2 pg (ref 26.0–34.0)
MCHC: 34.1 g/dL (ref 30.0–36.0)
MCV: 97.5 fL (ref 80.0–100.0)
Platelets: 309 10*3/uL (ref 150–400)
RBC: 3.61 MIL/uL — ABNORMAL LOW (ref 3.87–5.11)
RDW: 11.5 % (ref 11.5–15.5)
WBC: 4.9 10*3/uL (ref 4.0–10.5)
nRBC: 0 % (ref 0.0–0.2)

## 2023-05-24 LAB — OSMOLALITY: Osmolality: 272 mOsm/kg — ABNORMAL LOW (ref 275–295)

## 2023-05-24 LAB — SODIUM, URINE, RANDOM: Sodium, Ur: 93 mmol/L

## 2023-05-24 LAB — OSMOLALITY, URINE: Osmolality, Ur: 314 mOsm/kg (ref 300–900)

## 2023-05-24 NOTE — ED Notes (Signed)
Pt sent for sodium level to be rechecked from [facility]. Pt has no complaints.
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
## 2023-05-24 NOTE — ED Provider Notes (Signed)
[place] EMERGENCY DEPARTMENT AT [facility] Provider Note   CSN: [id number] Arrival date & time: 05/24/23  1608     History {Add pertinent medical, surgical, social history, OB history to HPI:1} No chief complaint on file.   Kathleen Robles is a 77 y.o. female.  77 year old female with a history of alcohol abuse, hypertension on amlodipine and trandolapril who presents emergency department with hyponatremia.  Patient has been having labs at [facility] and they have down trended to a sodium of 126 today.  They referred her to the emergency department though she is asymptomatic at this time.  Last drink was approximately 2 and half weeks ago when she had some wine.  Has been enrolled in rehab at [facility] for the past 6 days.  No hydrochlorothiazide use.  Was diagnosed in 2022 with hyponatremia and was started on salt tabs.  No history of cancer or thyroid disease.  No smoking history.  No history of TBI or other brain disease.  She is unsure exactly how much fluid she drinks per day.       Home Medications Prior to Admission medications   Medication Sig Start Date End Date Taking? Authorizing Provider  acetaminophen (TYLENOL) 500 MG tablet Take 500-1,000 mg by mouth every 6 (six) hours as needed for mild pain or headache.    [provider]  amLODipine (NORVASC) 2.5 MG tablet Take 1 tablet (2.5 mg total) by mouth daily. 10/11/21   Willeen Niece, MD  aspirin EC 81 MG tablet Take 81-162 mg by mouth daily. Swallow whole.    [provider]  b complex vitamins tablet Take 1 tablet by mouth daily after breakfast.    [provider]  CALCIUM-MAGNESIUM PO Take 2 tablets by mouth daily after breakfast.    [provider]  Cholecalciferol (VITAMIN D-3) 125 MCG (5000 UT) TABS Take 5,000 Units by mouth daily after breakfast.    [provider]  lamoTRIgine (LAMICTAL) 100 MG tablet Take 100 mg by mouth daily. 07/21/21   [provider]  memantine (NAMENDA) 10 MG tablet Take 10 mg by mouth 2 (two) times daily. 09/08/21   [provider]  Multiple Vitamin (MULTIVITAMIN WITH MINERALS) TABS tablet Take 2 tablets by mouth daily after breakfast.    [provider]  NON FORMULARY Take 2 capsules by mouth See admin instructions. Pure Neuro capsules- Take 2 capsules by mouth once a day    [provider]  NON FORMULARY Take 2-3 tablets by mouth See admin instructions. Swiss Kriss Herbal Laxative Tablets- Take 3 tablets by mouth at bedtime and an additional 2 tablets the following morning, if no B/M    [provider]  NONFORMULARY OR COMPOUNDED ITEM Take 25 mg by mouth See admin instructions. Progesterone SR 25 mg capsules- Take 25 mg by mouth at bedtime    [provider]  NONFORMULARY OR COMPOUNDED ITEM Take 3 mg by mouth See admin instructions. DHEA SR 3 mg capsules- Take 3 mg by mouth every morning    [provider]  trandolapril (MAVIK) 4 MG tablet Take 4 mg by mouth daily. 12/15/19   [provider]  traZODone (DESYREL) 150 MG tablet Take 150 mg by mouth at bedtime. 08/31/21   [provider]      Allergies    Codeine, Hydrocodone, and Naltrexone    Review of Systems   Review of Systems  Physical Exam Updated Vital Signs BP (!) 141/96 (BP Location: Right Arm)  Pulse 89   Temp 98.7 F (37.1 C) (Oral)   Resp 14   SpO2 100%  Physical Exam Vitals and nursing note reviewed.  Constitutional:      General: She is not in acute distress.    Appearance: She is well-developed.  HENT:     Head: Normocephalic and atraumatic.     Right Ear: External ear normal.     Left Ear: External ear normal.     Nose: Nose normal.  Eyes:     Extraocular Movements: Extraocular movements intact.     Conjunctiva/sclera: Conjunctivae normal.     Pupils: Pupils are equal, round, and reactive to light.  Pulmonary:     Effort: Pulmonary effort is normal. No  respiratory distress.  Musculoskeletal:     Cervical back: Normal range of motion and neck supple.     Right lower leg: No edema.     Left lower leg: No edema.  Skin:    General: Skin is warm and dry.  Neurological:     General: No focal deficit present.     Mental Status: She is alert and oriented to person, place, and time. Mental status is at baseline.     Cranial Nerves: No cranial nerve deficit.     Sensory: No sensory deficit.     Motor: No weakness.  Psychiatric:        Mood and Affect: Mood normal.     ED Results / Procedures / Treatments   Labs (all labs ordered are listed, but only abnormal results are displayed) Labs Reviewed  CBC - Abnormal; Notable for the following components:      Result Value   RBC 3.61 (*)    HCT 35.2 (*)    All other components within normal limits  COMPREHENSIVE METABOLIC PANEL - Abnormal; Notable for the following components:   Sodium 127 (*)    Chloride 93 (*)    Glucose, Bld 146 (*)    All other components within normal limits  OSMOLALITY    EKG None  Radiology No results found.  Procedures Procedures  {Document cardiac monitor, telemetry assessment procedure when appropriate:1}  Medications Ordered in ED Medications - No data to display  ED Course/ Medical Decision Making/ A&P Clinical Course as of 05/24/23 1812  Fri May 24, 2023  1804 126 today 129 yesterday [RP]    Clinical Course User Index [RP] Rondel Baton, MD   {   Click here for ABCD2, HEART and other calculatorsREFRESH Note before signing :1}                          Medical Decision Making Amount and/or Complexity of Data Reviewed Labs: ordered.   Kathleen Robles is a 77 y.o. female with comorbidities that complicate the patient evaluation including alcohol abuse, hypertension on amlodipine and trandolapril who presents emergency department with hyponatremia.    Initial Ddx:  SIADH, psychogenic polydipsia, medication side effect,  pseudohyponatremia  MDM:  Patient peers be asymptomatic from her hyponatremia at this time so do not feel she needs any emergent interventions.  Could potentially be due to psychogenic polydipsia with her history of alcohol abuse but has been cutting down on her wine intake since she is been in rehab.  Could potentially be due to her trazodone but is not on hydrochlorothiazide at all.  Plan:  ***  ED Summary/Re-evaluation:  ***  This patient presents to the ED for concern of complaints listed in  HPI, this involves an extensive number of treatment options, and is a complaint that carries with it a high risk of complications and morbidity. Disposition including potential need for admission considered.   Dispo: {Disposition:28069}  Additional history obtained from {Additional History:28067} Records reviewed {Records Reviewed:28068} The following labs were independently interpreted: {labs interpreted:28064} and show {lab findings:28250} I independently reviewed the following imaging with scope of interpretation limited to determining acute life threatening conditions related to emergency care: {imaging interpreted:28065} and agree with the radiologist interpretation with the following exceptions: none I personally reviewed and interpreted cardiac monitoring: {cardiac monitoring:28251} I personally reviewed and interpreted the pt's EKG: see above for interpretation  I have reviewed the patients home medications and made adjustments as needed Consults: {Consultants:28063} Social Determinants of health:  ***   {Document critical care time when appropriate:1} {Document review of labs and clinical decision tools ie heart score, Chads2Vasc2 etc:1}  {Document your independent review of radiology images, and any outside records:1} {Document your discussion with family members, caretakers, and with consultants:1} {Document social determinants of health affecting pt's care:1} {Document your  decision making why or why not admission, treatments were needed:1} Final Clinical Impression(s) / ED Diagnoses Final diagnoses:  None    Rx / DC Orders ED Discharge Orders     None

## 2023-05-24 NOTE — Discharge Instructions (Addendum)
You were seen for your low sodium in the emergency department.   At home, please drink no more than 1.5-2 liters of fluid per day. Stop taking your trazodone and talk to your primary doctor about starting another sleep aid if indicated. Try taking melatonin in the meantime.   Check your MyChart online for the results of any tests that had not resulted by the time you left the emergency department.   Follow-up with your primary doctor in 1 week regarding your visit.    Return immediately to the emergency department if you experience any of the following: severe headaches, seizures, confusion, or any other concerning symptoms.    Thank you for visiting our Emergency Department. It was a pleasure taking care of you today.

## 2023-06-09 ENCOUNTER — Emergency Department (HOSPITAL_BASED_OUTPATIENT_CLINIC_OR_DEPARTMENT_OTHER)
Admission: EM | Admit: 2023-06-09 | Discharge: 2023-06-09 | Disposition: A | Discharge status: Home / Self Care | Payer: Medicare HMO | Attending: Emergency Medicine | Admitting: Emergency Medicine

## 2023-06-09 ENCOUNTER — Encounter (HOSPITAL_BASED_OUTPATIENT_CLINIC_OR_DEPARTMENT_OTHER): Payer: Self-pay

## 2023-06-09 ENCOUNTER — Other Ambulatory Visit: Payer: Self-pay

## 2023-06-09 ENCOUNTER — Emergency Department (HOSPITAL_BASED_OUTPATIENT_CLINIC_OR_DEPARTMENT_OTHER): Payer: Medicare HMO

## 2023-06-09 DIAGNOSIS — W19XXXA Unspecified fall, initial encounter: Secondary | ICD-10-CM

## 2023-06-09 DIAGNOSIS — S0003XA Contusion of scalp, initial encounter: Secondary | ICD-10-CM | POA: Diagnosis not present

## 2023-06-09 DIAGNOSIS — W109XXA Fall (on) (from) unspecified stairs and steps, initial encounter: Secondary | ICD-10-CM | POA: Insufficient documentation

## 2023-06-09 DIAGNOSIS — S0990XA Unspecified injury of head, initial encounter: Secondary | ICD-10-CM | POA: Diagnosis present

## 2023-06-09 MED ORDER — ACETAMINOPHEN 325 MG PO TABS
650.0000 mg | ORAL_TABLET | Freq: Once | ORAL | Status: AC
  Administered 2023-06-09: 650 mg via ORAL
  Filled 2023-06-09: qty 2

## 2023-06-09 MED ORDER — ACETAMINOPHEN 325 MG PO TABS
325.0000 mg | ORAL_TABLET | Freq: Once | ORAL | Status: AC
  Administered 2023-06-09: 325 mg via ORAL
  Filled 2023-06-09: qty 1

## 2023-06-09 NOTE — ED Provider Notes (Signed)
[place] EMERGENCY DEPARTMENT AT [facility] Provider Note   CSN: [id number] Arrival date & time: 06/09/23  1723     History Chief Complaint  Patient presents with   Kathleen Robles is a 77 y.o. female.  Patient presents to the emergency department complaints of mechanical fall.  She reports that she was looking at wildlife at her facility earlier today when she missed a step and fell forward hitting her left forehead against the ground on concrete.  Denies any syncopal episode prior to this.  No loss of consciousness from the fall or head strike.  Not currently on blood thinners.  Does endorse a mild headache but denies any nausea or vomiting.   Fall Associated symptoms include headaches.       Home Medications Prior to Admission medications   Medication Sig Start Date End Date Taking? Authorizing Provider  acetaminophen (TYLENOL) 500 MG tablet Take 500-1,000 mg by mouth every 6 (six) hours as needed for mild pain or headache.    [provider]  amLODipine (NORVASC) 2.5 MG tablet Take 1 tablet (2.5 mg total) by mouth daily. 10/11/21   Willeen Niece, MD  aspirin EC 81 MG tablet Take 81-162 mg by mouth daily. Swallow whole.    [provider]  b complex vitamins tablet Take 1 tablet by mouth daily after breakfast.    [provider]  CALCIUM-MAGNESIUM PO Take 2 tablets by mouth daily after breakfast.    [provider]  Cholecalciferol (VITAMIN D-3) 125 MCG (5000 UT) TABS Take 5,000 Units by mouth daily after breakfast.    [provider]  lamoTRIgine (LAMICTAL) 100 MG tablet Take 100 mg by mouth daily. 07/21/21   [provider]  memantine (NAMENDA) 10 MG tablet Take 10 mg by mouth 2 (two) times daily. 09/08/21   [provider]  Multiple Vitamin (MULTIVITAMIN WITH MINERALS) TABS tablet Take 2 tablets by mouth daily after breakfast.    [provider]  NON FORMULARY Take 2 capsules by mouth See  admin instructions. Pure Neuro capsules- Take 2 capsules by mouth once a day    [provider]  NON FORMULARY Take 2-3 tablets by mouth See admin instructions. Swiss Kriss Herbal Laxative Tablets- Take 3 tablets by mouth at bedtime and an additional 2 tablets the following morning, if no B/M    [provider]  NONFORMULARY OR COMPOUNDED ITEM Take 25 mg by mouth See admin instructions. Progesterone SR 25 mg capsules- Take 25 mg by mouth at bedtime    [provider]  NONFORMULARY OR COMPOUNDED ITEM Take 3 mg by mouth See admin instructions. DHEA SR 3 mg capsules- Take 3 mg by mouth every morning    [provider]  trandolapril (MAVIK) 4 MG tablet Take 4 mg by mouth daily. 12/15/19   [provider]      Allergies    Codeine, Hydrocodone, and Naltrexone    Review of Systems   Review of Systems  Neurological:  Positive for headaches.  All other systems reviewed and are negative.   Physical Exam Updated Vital Signs BP (!) 185/117   Pulse 87   Temp 98.4 F (36.9 C) (Oral)   Resp 20   Ht 5\' 4"  (1.626 m)   Wt 49.4 kg   SpO2 98%   BMI 18.71 kg/m  Physical Exam Vitals and nursing note reviewed.  HENT:     Head: Normocephalic and atraumatic.  Eyes:  General: No scleral icterus.       Right eye: No discharge.        Left eye: No discharge.  Cardiovascular:     Rate and Rhythm: Normal rate and regular rhythm.  Skin:    General: Skin is warm and dry.     Findings: Bruising present. No lesion or rash.     Comments: Area of ecchymosis and swelling noted to the left frontal aspect of patient's scalp.  No obvious laceration at the site.     ED Results / Procedures / Treatments   Labs (all labs ordered are listed, but only abnormal results are displayed) Labs Reviewed - No data to display  EKG None  Radiology CT Head Wo Contrast  Result Date: 06/09/2023 CLINICAL DATA:  Recent trip and fall with headaches, initial encounter EXAM:  CT HEAD WITHOUT CONTRAST TECHNIQUE: Contiguous axial images were obtained from the base of the skull through the vertex without intravenous contrast. RADIATION DOSE REDUCTION: This exam was performed according to the departmental dose-optimization program which includes automated exposure control, adjustment of the mA and/or kV according to patient size and/or use of iterative reconstruction technique. COMPARISON:  10/07/2021 FINDINGS: Brain: No evidence of acute infarction, hemorrhage, hydrocephalus, extra-axial collection or mass lesion/mass effect. Mild atrophic changes are noted. Vascular: No hyperdense vessel or unexpected calcification. Skull: Normal. Negative for fracture or focal lesion. Sinuses/Orbits: No acute finding. Other: Soft tissue swelling is noted in the left lateral periorbital region consistent with the recent injury. IMPRESSION: Soft tissue swelling consistent with the recent injury. Mild atrophic changes without acute intracranial abnormality. Electronically Signed   By: Alcide Clever M.D.   On: 06/09/2023 19:12    Procedures Procedures   Medications Ordered in ED Medications  acetaminophen (TYLENOL) tablet 325 mg (has no administration in time range)  acetaminophen (TYLENOL) tablet 650 mg (650 mg Oral Given 06/09/23 2009)    ED Course/ Medical Decision Making/ A&P                           Medical Decision Making Amount and/or Complexity of Data Reviewed Radiology: ordered.  Risk OTC drugs.   This patient presents to the ED for concern of fall.  Differential diagnosis includes syncope, loss consciousness, mechanical fall, dehydration, SAH   Imaging Studies ordered:  I ordered imaging studies including CT head I independently visualized and interpreted imaging which showed no evidence of any acute intracranial abnormality I agree with the radiologist interpretation   Medicines ordered and prescription drug management:  I ordered medication including Tylenol for  pain Reevaluation of the patient after these medicines showed that the patient improved I have reviewed the patients home medicines and have made adjustments as needed   Problem List / ED Course:  Patient presents to the emergency department complaints of a fall.  She reports that she fell forward striking the left side of her forehead against the concrete ground.  Denies any syncope or loss of consciousness with this.  Not currently on blood thinners.  Will evaluate patient with CT imaging of head for further evaluation of potential bleed given mechanism of injury. CT imaging is negative for any acute abnormality.  Informed patient of these findings and evaluated patient which I found patient to be hemodynamically stable and in no acute distress.  She does endorse a mild headache at this time but denies any nausea, vomiting, blurry vision since the fall.  I believe the patient  is currently stable for discharge home and outpatient follow-up.  Did advise patient that she should take Tylenol for pain relief.  Patient is agreeable to treatment plan and verbalized understanding all return precautions.  Low concern for any acute injury elsewhere as patient is able to tolerate ambulation without difficulty and denies any pain elsewhere.  Patient will be discharged back to home.  All questions answered prior to patient discharge.  Final Clinical Impression(s) / ED Diagnoses Final diagnoses:  Fall, initial encounter  Contusion of scalp, initial encounter    Rx / DC Orders ED Discharge Orders     None         Salomon Mast 06/09/23 2107    Tegeler, Canary Brim, MD 06/09/23 2213

## 2023-06-09 NOTE — Discharge Instructions (Signed)
You were seen in the emergency department for a fall.  Imaging was negative for any acute bleeding noted.  I would advise taking Tylenol initially for pain if needed.  You may start taking ibuprofen 2 days after the fall.  If you have any acute worsening or symptoms such as worsening, severe, persistent headache, nausea, vomiting, confusion, please return to the emergency department for further evaluation.

## 2023-06-09 NOTE — ED Triage Notes (Signed)
States tripped and fell on while stepping down one step  Fell onto right knee then hit head.  Denies LOC  Pain to knee and head.  Swelling to left side head and knee
